# Patient Record
Sex: Male | Born: 1975 | Race: Black or African American | Marital: Single | Smoking: Current every day smoker
Health system: Southern US, Community
[De-identification: ages and names within clinical notes are randomized; demographics above are authoritative.]

## PROBLEM LIST (undated history)

## (undated) DIAGNOSIS — J45909 Unspecified asthma, uncomplicated: Secondary | ICD-10-CM

## (undated) DIAGNOSIS — T7840XA Allergy, unspecified, initial encounter: Secondary | ICD-10-CM

## (undated) HISTORY — DX: Allergy, unspecified, initial encounter: T78.40XA

## (undated) HISTORY — PX: TUMOR REMOVAL: SHX12

---

## 2003-10-19 ENCOUNTER — Ambulatory Visit (HOSPITAL_COMMUNITY): Admission: RE | Admit: 2003-10-19 | Discharge: 2003-10-19 | Payer: Self-pay | Admitting: Family Medicine

## 2004-11-30 ENCOUNTER — Emergency Department (HOSPITAL_COMMUNITY): Admission: EM | Admit: 2004-11-30 | Discharge: 2004-11-30 | Payer: Self-pay | Admitting: Family Medicine

## 2005-07-26 ENCOUNTER — Ambulatory Visit: Payer: Self-pay | Admitting: Family Medicine

## 2005-07-31 ENCOUNTER — Ambulatory Visit: Payer: Self-pay | Admitting: Family Medicine

## 2005-10-19 ENCOUNTER — Ambulatory Visit: Payer: Self-pay | Admitting: Family Medicine

## 2006-07-30 ENCOUNTER — Ambulatory Visit: Payer: Self-pay | Admitting: Family Medicine

## 2007-01-21 DIAGNOSIS — J309 Allergic rhinitis, unspecified: Secondary | ICD-10-CM | POA: Insufficient documentation

## 2007-01-21 DIAGNOSIS — J45909 Unspecified asthma, uncomplicated: Secondary | ICD-10-CM | POA: Insufficient documentation

## 2010-05-06 ENCOUNTER — Encounter: Payer: Self-pay | Admitting: Family Medicine

## 2010-08-17 ENCOUNTER — Ambulatory Visit: Payer: Self-pay | Admitting: Family Medicine

## 2010-09-28 ENCOUNTER — Ambulatory Visit (HOSPITAL_COMMUNITY)
Admission: RE | Admit: 2010-09-28 | Discharge: 2010-09-28 | Disposition: A | Discharge status: Home / Self Care | Payer: 59 | Source: Ambulatory Visit | Attending: Family Medicine | Admitting: Family Medicine

## 2010-09-28 ENCOUNTER — Other Ambulatory Visit: Payer: Self-pay | Admitting: Family Medicine

## 2010-09-28 DIAGNOSIS — R0789 Other chest pain: Secondary | ICD-10-CM

## 2010-09-28 DIAGNOSIS — R079 Chest pain, unspecified: Secondary | ICD-10-CM | POA: Insufficient documentation

## 2010-09-28 DIAGNOSIS — M25819 Other specified joint disorders, unspecified shoulder: Secondary | ICD-10-CM | POA: Insufficient documentation

## 2011-10-21 ENCOUNTER — Ambulatory Visit (INDEPENDENT_AMBULATORY_CARE_PROVIDER_SITE_OTHER): Payer: 59 | Admitting: Family Medicine

## 2011-10-21 VITALS — BP 140/100 | HR 78 | Resp 16 | Ht 75.0 in | Wt 246.0 lb

## 2011-10-21 DIAGNOSIS — J45909 Unspecified asthma, uncomplicated: Secondary | ICD-10-CM

## 2011-10-21 DIAGNOSIS — L259 Unspecified contact dermatitis, unspecified cause: Secondary | ICD-10-CM

## 2011-10-21 DIAGNOSIS — L309 Dermatitis, unspecified: Secondary | ICD-10-CM

## 2011-10-21 MED ORDER — MOMETASONE FURO-FORMOTEROL FUM 200-5 MCG/ACT IN AERO: 2.0000 | INHALATION_SPRAY | Freq: Two times a day (BID) | RESPIRATORY_TRACT | Status: DC

## 2011-10-21 MED ORDER — PREDNISONE 20 MG PO TABS: ORAL_TABLET | ORAL | Status: DC

## 2011-10-21 NOTE — Progress Notes (Signed)
@[email or web address] @   Patient ID: IZEN PETZ MRN: [id number], DOB: 06/12/1975, 36 y.o. Date of Encounter: 10/21/2011, 9:00 AM  Primary Physician: No primary provider on file.  Chief Complaint:  Chief Complaint  Patient presents with  . Eczema    behind right ear  . Cough    chest congestion x 2 weeks    HPI: 36 y.o. year old male presents with a 14 day history of nasal congestion, post nasal drip, sore throat, and cough. Mild sinus pressure. Afebrile. No chills. Nasal congestion thick and green/yellow. Cough is productive of green/yellow sputum and not associated with time of day. Ears feel full, leading to sensation of muffled hearing. Has tried OTC cold preps without success. No GI complaints. Appetite good  No sick contacts, recent antibiotics, or recent travels.   No leg trauma, sedentary periods, h/o cancer, or tobacco use.  No past medical history on file.   Home Meds: Prior to Admission medications   Medication Sig Start Date End Date Taking? Authorizing Provider  Mometasone Furo-Formoterol Fum 200-5 MCG/ACT AERO Inhale 2 puffs into the lungs 2 (two) times daily. 10/21/11   Elvina Sidle, MD  predniSONE (DELTASONE) 20 MG tablet 3-2-2-1-1-1 daily with food 10/21/11   Elvina Sidle, MD    Allergies: No Known Allergies  History   Social History  . Marital Status: Single    Spouse Name: N/A    Number of Children: N/A  . Years of Education: N/A   Occupational History  . Not on file.   Social History Main Topics  . Smoking status: Current Everyday Smoker -- 0.1 packs/day for 20 years    Types: Cigars  . Smokeless tobacco: Not on file  . Alcohol Use: Not on file  . Drug Use: Not on file  . Sexually Active: Not on file   Other Topics Concern  . Not on file   Social History Narrative  . No narrative on file     Review of Systems: Constitutional: negative for chills, fever, night sweats or weight changes Cardiovascular: negative for chest pain or  palpitations Respiratory: negative for hemoptysis, wheezing, or shortness of breath Abdominal: negative for abdominal pain, nausea, vomiting or diarrhea Dermatological: itchy facial rash Neurologic: negative for headache   Physical Exam: Blood pressure 140/100, pulse 78, resp. rate 16, height 6\' 3"  (1.905 m), weight 246 lb (111.585 kg), SpO2 96.00%., Body mass index is 30.75 kg/(m^2). General: Well developed, well nourished, in no acute distress. Head: Normocephalic, atraumatic, eyes without discharge, sclera non-icteric, nares are congested. Bilateral auditory canals clear, TM's are without perforation, pearly grey with reflective cone of light bilaterally. No sinus TTP. Oral cavity moist, dentition normal. Posterior pharynx with post nasal drip and mild erythema. No peritonsillar abscess or tonsillar exudate.  Eczematous rash right ear lobe Neck: Supple. No thyromegaly. Full ROM. No lymphadenopathy. Lungs: Coarse breath sounds bilaterally without wheezes, rales, or rhonchi. Breathing is unlabored.  Heart: RRR with S1 S2. No murmurs, rubs, or gallops appreciated. Msk:  Strength and tone normal for age. Extremities: No clubbing or cyanosis. No edema. Neuro: Alert and oriented X 3. Moves all extremities spontaneously. CNII-XII grossly in tact. Psych:  Responds to questions appropriately with a normal affect.   Labs:   ASSESSMENT AND PLAN:  36 y.o. year old male with bronchitis, asthmatic, and eczema - 1. Asthma  Mometasone Furo-Formoterol Fum 200-5 MCG/ACT AERO, predniSONE (DELTASONE) 20 MG tablet  2. Eczema  predniSONE (DELTASONE) 20 MG tablet   Urged to quit smoking  -  Tylenol/Motrin prn -Rest/fluids -RTC precautions -RTC 3-5 days if no improvement  Signed, Elvina Sidle, MD 10/21/2011 9:00 AM

## 2011-10-21 NOTE — Patient Instructions (Addendum)
Smoking Cessation This document explains the best ways for you to quit smoking and new treatments to help. It lists new medicines that can double or triple your chances of quitting and quitting for good. It also considers ways to avoid relapses and concerns you may have about quitting, including weight gain. NICOTINE: A POWERFUL ADDICTION If you have tried to quit smoking, you know how hard it can be. It is hard because nicotine is a very addictive drug. For some people, it can be as addictive as heroin or cocaine. Usually, people make 2 or 3 tries, or more, before finally being able to quit. Each time you try to quit, you can learn about what helps and what hurts. Quitting takes hard work and a lot of effort, but you can quit smoking. QUITTING SMOKING IS ONE OF THE MOST IMPORTANT THINGS YOU WILL EVER DO.  You will live longer, feel better, and live better.   The impact on your body of quitting smoking is felt almost immediately:   Within 20 minutes, blood pressure decreases. Pulse returns to its normal level.   After 8 hours, carbon monoxide levels in the blood return to normal. Oxygen level increases.   After 24 hours, chance of heart attack starts to decrease. Breath, hair, and body stop smelling like smoke.   After 48 hours, damaged nerve endings begin to recover. Sense of taste and smell improve.   After 72 hours, the body is virtually free of nicotine. Bronchial tubes relax and breathing becomes easier.   After 2 to 12 weeks, lungs can hold more air. Exercise becomes easier and circulation improves.   Quitting will reduce your risk of having a heart attack, stroke, cancer, or lung disease:   After 1 year, the risk of coronary heart disease is cut in half.   After 5 years, the risk of stroke falls to the same as a nonsmoker.   After 10 years, the risk of lung cancer is cut in half and the risk of other cancers decreases significantly.   After 15 years, the risk of coronary heart  disease drops, usually to the level of a nonsmoker.   If you are pregnant, quitting smoking will improve your chances of having a healthy baby.   The people you live with, especially your children, will be healthier.   You will have extra money to spend on things other than cigarettes.  FIVE KEYS TO QUITTING Studies have shown that these 5 steps will help you quit smoking and quit for good. You have the best chances of quitting if you use them together: 1. Get ready.  2. Get support and encouragement.  3. Learn new skills and behaviors.  4. Get medicine to reduce your nicotine addiction and use it correctly.  5. Be prepared for relapse or difficult situations. Be determined to continue trying to quit, even if you do not succeed at first.  1. GET READY  Set a quit date.   Change your environment.   Get rid of ALL cigarettes, ashtrays, matches, and lighters in your home, car, and place of work.   Do not let people smoke in your home.   Review your past attempts to quit. Think about what worked and what did not.   Once you quit, do not smoke. NOT EVEN A PUFF!  2. GET SUPPORT AND ENCOURAGEMENT Studies have shown that you have a better chance of being successful if you have help. You can get support in many ways.  Tell   your family, friends, and coworkers that you are going to quit and need their support. Ask them not to smoke around you.   Talk to your caregivers (doctor, dentist, nurse, pharmacist, psychologist, and/or smoking counselor).   Get individual, group, or telephone counseling and support. The more counseling you have, the better your chances are of quitting. Programs are available at local hospitals and health centers. Call your local health department for information about programs in your area.   Spiritual beliefs and practices may help some smokers quit.   Quit meters are small computer programs online or downloadable that keep track of quit statistics, such as amount  of "quit-time," cigarettes not smoked, and money saved.   Many smokers find one or more of the many self-help books available useful in helping them quit and stay off tobacco.  3. LEARN NEW SKILLS AND BEHAVIORS  Try to distract yourself from urges to smoke. Talk to someone, go for a walk, or occupy your time with a task.   When you first try to quit, change your routine. Take a different route to work. Drink tea instead of coffee. Eat breakfast in a different place.   Do something to reduce your stress. Take a hot bath, exercise, or read a book.   Plan something enjoyable to do every day. Reward yourself for not smoking.   Explore interactive web-based programs that specialize in helping you quit.  4. GET MEDICINE AND USE IT CORRECTLY Medicines can help you stop smoking and decrease the urge to smoke. Combining medicine with the above behavioral methods and support can quadruple your chances of successfully quitting smoking. The [place] Food and Drug Administration (FDA) has approved 7 medicines to help you quit smoking. These medicines fall into 3 categories.  Nicotine replacement therapy (delivers nicotine to your body without the negative effects and risks of smoking):   Nicotine gum: Available over-the-counter.   Nicotine lozenges: Available over-the-counter.   Nicotine inhaler: Available by prescription.   Nicotine nasal spray: Available by prescription.   Nicotine skin patches (transdermal): Available by prescription and over-the-counter.   Antidepressant medicine (helps people abstain from smoking, but how this works is unknown):   Bupropion sustained-release (SR) tablets: Available by prescription.   Nicotinic receptor partial agonist (simulates the effect of nicotine in your brain):   Varenicline tartrate tablets: Available by prescription.   Ask your caregiver for advice about which medicines to use and how to use them. Carefully read the information on the package.    Everyone who is trying to quit may benefit from using a medicine. If you are pregnant or trying to become pregnant, nursing an infant, you are under age 18, or you smoke fewer than 10 cigarettes per day, talk to your caregiver before taking any nicotine replacement medicines.   You should stop using a nicotine replacement product and call your caregiver if you experience nausea, dizziness, weakness, vomiting, fast or irregular heartbeat, mouth problems with the lozenge or gum, or redness or swelling of the skin around the patch that does not go away.   Do not use any other product containing nicotine while using a nicotine replacement product.   Talk to your caregiver before using these products if you have diabetes, heart disease, asthma, stomach ulcers, you had a recent heart attack, you have high blood pressure that is not controlled with medicine, a history of irregular heartbeat, or you have been prescribed medicine to help you quit smoking.  5. BE PREPARED FOR RELAPSE OR   DIFFICULT SITUATIONS  Most relapses occur within the first 3 months after quitting. Do not be discouraged if you start smoking again. Remember, most people try several times before they finally quit.   You may have symptoms of withdrawal because your body is used to nicotine. You may crave cigarettes, be irritable, feel very hungry, cough often, get headaches, or have difficulty concentrating.   The withdrawal symptoms are only temporary. They are strongest when you first quit, but they will go away within 10 to 14 days.  Here are some difficult situations to watch for:  Alcohol. Avoid drinking alcohol. Drinking lowers your chances of successfully quitting.   Caffeine. Try to reduce the amount of caffeine you consume. It also lowers your chances of successfully quitting.   Other smokers. Being around smoking can make you want to smoke. Avoid smokers.   Weight gain. Many smokers will gain weight when they quit, usually  less than 10 pounds. Eat a healthy diet and stay active. Do not let weight gain distract you from your main goal, quitting smoking. Some medicines that help you quit smoking may also help delay weight gain. You can always lose the weight gained after you quit.   Bad mood or depression. There are a lot of ways to improve your mood other than smoking.  If you are having problems with any of these situations, talk to your caregiver. SPECIAL SITUATIONS AND CONDITIONS Studies suggest that everyone can quit smoking. Your situation or condition can give you a special reason to quit.  Pregnant women/new mothers: By quitting, you protect your baby's health and your own.   Hospitalized patients: By quitting, you reduce health problems and help healing.   Heart attack patients: By quitting, you reduce your risk of a second heart attack.   Lung, head, and neck cancer patients: By quitting, you reduce your chance of a second cancer.   Parents of children and adolescents: By quitting, you protect your children from illnesses caused by secondhand smoke.  QUESTIONS TO THINK ABOUT Think about the following questions before you try to stop smoking. You may want to talk about your answers with your caregiver.  Why do you want to quit?   If you tried to quit in the past, what helped and what did not?   What will be the most difficult situations for you after you quit? How will you plan to handle them?   Who can help you through the tough times? Your family? Friends? Caregiver?   What pleasures do you get from smoking? What ways can you still get pleasure if you quit?  Here are some questions to ask your caregiver:  How can you help me to be successful at quitting?   What medicine do you think would be best for me and how should I take it?   What should I do if I need more help?   What is smoking withdrawal like? How can I get information on withdrawal?  Quitting takes hard work and a lot of effort,  but you can quit smoking. FOR MORE INFORMATION  Smokefree.gov (http://www.davis-sullivan.com/) provides free, accurate, evidence-based information and professional assistance to help support the immediate and long-term needs of people trying to quit smoking. Document Released: 03/27/2001 Document Revised: 03/22/2011 Document Reviewed: 01/17/2009 [facility] [place] Patient Information 2012 [place], [place]   .Asthma Attack Prevention HOW CAN ASTHMA BE PREVENTED? Currently, there is no way to prevent asthma from starting. However, you can take steps to control the disease and prevent  its symptoms after you have been diagnosed. Learn about your asthma and how to control it. Take an active role to control your asthma by working with your caregiver to create and follow an asthma action plan. An asthma action plan guides you in taking your medicines properly, avoiding factors that make your asthma worse, tracking your level of asthma control, responding to worsening asthma, and seeking emergency care when needed. To track your asthma, keep records of your symptoms, check your peak flow number using a peak flow meter (handheld device that shows how well air moves out of your lungs), and get regular asthma checkups.  Other ways to prevent asthma attacks include:  Use medicines as your caregiver directs.   Identify and avoid things that make your asthma worse (as much as you can).   Keep track of your asthma symptoms and level of control.   Get regular checkups for your asthma.   With your caregiver, write a detailed plan for taking medicines and managing an asthma attack. Then be sure to follow your action plan. Asthma is an ongoing condition that needs regular monitoring and treatment.   Identify and avoid asthma triggers. A number of outdoor allergens and irritants (pollen, mold, cold air, air pollution) can trigger asthma attacks. Find out what causes or makes your asthma worse, and take steps to avoid those  triggers (see below).   Monitor your breathing. Learn to recognize warning signs of an attack, such as slight coughing, wheezing or shortness of breath. However, your lung function may already decrease before you notice any signs or symptoms, so regularly measure and record your peak airflow with a home peak flow meter.   Identify and treat attacks early. If you act quickly, you're less likely to have a severe attack. You will also need less medicine to control your symptoms. When your peak flow measurements decrease and alert you to an upcoming attack, take your medicine as instructed, and immediately stop any activity that may have triggered the attack. If your symptoms do not improve, get medical help.   Pay attention to increasing quick-relief inhaler use. If you find yourself relying on your quick-relief inhaler (such as albuterol), your asthma is not under control. See your caregiver about adjusting your treatment.  IDENTIFY AND CONTROL FACTORS THAT MAKE YOUR ASTHMA WORSE A number of common things can set off or make your asthma symptoms worse (asthma triggers). Keep track of your asthma symptoms for several weeks, detailing all the environmental and emotional factors that are linked with your asthma. When you have an asthma attack, go back to your asthma diary to see which factor, or combination of factors, might have contributed to it. Once you know what these factors are, you can take steps to control many of them.  Allergies: If you have allergies and asthma, it is important to take asthma prevention steps at home. Asthma attacks (worsening of asthma symptoms) can be triggered by allergies, which can cause temporary increased inflammation of your airways. Minimizing contact with the substance to which you are allergic will help prevent an asthma attack. Animal Dander:   Some people are allergic to the flakes of skin or dried saliva from animals with fur or feathers. Keep these pets out of your  home.   If you can't keep a pet outdoors, keep the pet out of your bedroom and other sleeping areas at all times, and keep the door closed.   Remove carpets and furniture covered with cloth from your  home. If that is not possible, keep the pet away from fabric-covered furniture and carpets.  Dust Mites:  Many people with asthma are allergic to dust mites. Dust mites are tiny bugs that are found in every home, in mattresses, pillows, carpets, fabric-covered furniture, bedcovers, clothes, stuffed toys, fabric, and other fabric-covered items.   Cover your mattress in a special dust-proof cover.   Cover your pillow in a special dust-proof cover, or wash the pillow each week in hot water. Water must be hotter than 130 F to kill dust mites. Cold or warm water used with detergent and bleach can also be effective.   Wash the sheets and blankets on your bed each week in hot water.   Try not to sleep or lie on cloth-covered cushions.   Call ahead when traveling and ask for a smoke-free hotel room. Bring your own bedding and pillows, in case the hotel only supplies feather pillows and down comforters, which may contain dust mites and cause asthma symptoms.   Remove carpets from your bedroom and those laid on concrete, if you can.   Keep stuffed toys out of the bed, or wash the toys weekly in hot water or cooler water with detergent and bleach.  Cockroaches:  Many people with asthma are allergic to the droppings and remains of cockroaches.   Keep food and garbage in closed containers. Never leave food out.   Use poison baits, traps, powders, gels, or paste (for example, boric acid).   If a spray is used to kill cockroaches, stay out of the room until the odor goes away.  Indoor Mold:  Fix leaky faucets, pipes, or other sources of water that have mold around them.   Clean moldy surfaces with a cleaner that has bleach in it.  Pollen and Outdoor Mold:  When pollen or mold spore counts are  high, try to keep your windows closed.   Stay indoors with windows closed from late morning to afternoon, if you can. Pollen and some mold spore counts are highest at that time.   Ask your caregiver whether you need to take or increase anti-inflammatory medicine before your allergy season starts.  Irritants:   Tobacco smoke is an irritant. If you smoke, ask your caregiver how you can quit. Ask family members to quit smoking, too. Do not allow smoking in your home or car.   If possible, do not use a wood-burning stove, kerosene heater, or fireplace. Minimize exposure to all sources of smoke, including incense, candles, fires, and fireworks.   Try to stay away from strong odors and sprays, such as perfume, talcum powder, hair spray, and paints.   Decrease humidity in your home and use an indoor air cleaning device. Reduce indoor humidity to below 60 percent. Dehumidifiers or central air conditioners can do this.   Try to have someone else vacuum for you once or twice a week, if you can. Stay out of rooms while they are being vacuumed and for a short while afterward.   If you vacuum, use a dust mask from a hardware store, a double-layered or microfilter vacuum cleaner bag, or a vacuum cleaner with a HEPA filter.   Sulfites in foods and beverages can be irritants. Do not drink beer or wine, or eat dried fruit, processed potatoes, or shrimp if they cause asthma symptoms.   Cold air can trigger an asthma attack. Cover your nose and mouth with a scarf on cold or windy days.   Several health conditions can  make asthma more difficult to manage, including runny nose, sinus infections, reflux disease, psychological stress, and sleep apnea. Your caregiver will treat these conditions, as well.   Avoid close contact with people who have a cold or the flu, since your asthma symptoms may get worse if you catch the infection from them. Wash your hands thoroughly after touching items that may have been  handled by people with a respiratory infection.   Get a flu shot every year to protect against the flu virus, which often makes asthma worse for days or weeks. Also get a pneumonia shot once every five to 10 years.  Drugs:  Aspirin and other painkillers can cause asthma attacks. 10% to 20% of people with asthma have sensitivity to aspirin or a group of painkillers called non-steroidal anti-inflammatory drugs (NSAIDS), such as ibuprofen and naproxen. These drugs are used to treat pain and reduce fevers. Asthma attacks caused by any of these medicines can be severe and even fatal. These drugs must be avoided in people who have known aspirin sensitive asthma. Products with acetaminophen are considered safe for people who have asthma. It is important that people with aspirin sensitivity read labels of all over-the-counter drugs used to treat pain, colds, coughs, and fever.   Beta blockers and ACE inhibitors are other drugs which you should discuss with your caregiver, in relation to your asthma.  ALLERGY SKIN TESTING  Ask your asthma caregiver about allergy skin testing or blood testing (RAST test) to identify the allergens to which you are sensitive. If you are found to have allergies, allergy shots (immunotherapy) for asthma may help prevent future allergies and asthma. With allergy shots, small doses of allergens (substances to which you are allergic) are injected under your skin on a regular schedule. Over a period of time, your body may become used to the allergen and less responsive with asthma symptoms. You can also take measures to minimize your exposure to those allergens. EXERCISE  If you have exercise-induced asthma, or are planning vigorous exercise, or exercise in cold, humid, or dry environments, prevent exercise-induced asthma by following your caregiver's advice regarding asthma treatment before exercising. Document Released: 03/21/2009 Document Revised: 03/22/2011 Document Reviewed:  03/21/2009 [facility] Patient Information 2012 [place], [place].

## 2011-10-31 ENCOUNTER — Telehealth: Payer: Self-pay

## 2011-10-31 NOTE — Telephone Encounter (Signed)
PT STATES THAT HE HAS BEEN TAKING THE PREDNISONE PRESCRIBED TO HIM BUT HE IS STILL HAVING CONGESTION IN HIS CHEST. PLEASE ADVISE. [phone] PHARMACY:[facility] ON [address]

## 2011-10-31 NOTE — Telephone Encounter (Signed)
Per Dr. Loma Boston note, he was to RTC in 3-5 days if no improvement. He should be using inhaler as well.

## 2011-10-31 NOTE — Telephone Encounter (Signed)
Lm for call back.

## 2011-11-04 NOTE — Telephone Encounter (Signed)
[facility] notifying patient info below, and to RTC if not better.

## 2012-01-09 ENCOUNTER — Emergency Department (HOSPITAL_COMMUNITY)
Admission: EM | Admit: 2012-01-09 | Discharge: 2012-01-10 | Disposition: A | Discharge status: Home / Self Care | Payer: 59 | Attending: Emergency Medicine | Admitting: Emergency Medicine

## 2012-01-09 ENCOUNTER — Encounter (HOSPITAL_COMMUNITY): Payer: Self-pay | Admitting: *Deleted

## 2012-01-09 DIAGNOSIS — T2200XA Burn of unspecified degree of shoulder and upper limb, except wrist and hand, unspecified site, initial encounter: Secondary | ICD-10-CM

## 2012-01-09 DIAGNOSIS — X12XXXA Contact with other hot fluids, initial encounter: Secondary | ICD-10-CM | POA: Insufficient documentation

## 2012-01-09 DIAGNOSIS — T310 Burns involving less than 10% of body surface: Secondary | ICD-10-CM | POA: Insufficient documentation

## 2012-01-09 DIAGNOSIS — T23239A Burn of second degree of unspecified multiple fingers (nail), not including thumb, initial encounter: Secondary | ICD-10-CM | POA: Insufficient documentation

## 2012-01-09 DIAGNOSIS — T2016XA Burn of first degree of forehead and cheek, initial encounter: Secondary | ICD-10-CM | POA: Insufficient documentation

## 2012-01-09 DIAGNOSIS — T22219A Burn of second degree of unspecified forearm, initial encounter: Secondary | ICD-10-CM | POA: Insufficient documentation

## 2012-01-09 HISTORY — DX: Unspecified asthma, uncomplicated: J45.909

## 2012-01-09 MED ORDER — HYDROMORPHONE HCL PF 2 MG/ML IJ SOLN
2.0000 mg | Freq: Once | INTRAMUSCULAR | Status: AC
  Administered 2012-01-09: 2 mg via INTRAVENOUS
  Filled 2012-01-09: qty 1

## 2012-01-09 MED ORDER — SILVER SULFADIAZINE 1 % EX CREA
TOPICAL_CREAM | Freq: Once | CUTANEOUS | Status: AC
  Administered 2012-01-09: via TOPICAL
  Filled 2012-01-09: qty 50

## 2012-01-09 MED ORDER — SODIUM CHLORIDE 0.9 % IV BOLUS (SEPSIS)
1000.0000 mL | Freq: Once | INTRAVENOUS | Status: AC
  Administered 2012-01-09: 1000 mL via INTRAVENOUS

## 2012-01-09 MED ORDER — TETANUS-DIPHTH-ACELL PERTUSSIS 5-2.5-18.5 LF-MCG/0.5 IM SUSP
0.5000 mL | Freq: Once | INTRAMUSCULAR | Status: AC
  Administered 2012-01-09: 0.5 mL via INTRAMUSCULAR
  Filled 2012-01-09: qty 0.5

## 2012-01-09 MED ORDER — KETOROLAC TROMETHAMINE 30 MG/ML IJ SOLN
30.0000 mg | Freq: Once | INTRAMUSCULAR | Status: AC
  Administered 2012-01-10: 30 mg via INTRAVENOUS
  Filled 2012-01-09: qty 1

## 2012-01-09 MED ORDER — ONDANSETRON HCL 4 MG PO TABS: 4.0000 mg | ORAL_TABLET | Freq: Four times a day (QID) | ORAL | Status: DC

## 2012-01-09 MED ORDER — OXYCODONE-ACETAMINOPHEN 5-325 MG PO TABS: 1.0000 | ORAL_TABLET | Freq: Four times a day (QID) | ORAL | Status: DC | PRN

## 2012-01-09 MED ORDER — ONDANSETRON HCL 4 MG/2ML IJ SOLN
4.0000 mg | Freq: Once | INTRAMUSCULAR | Status: AC
  Administered 2012-01-09: 4 mg via INTRAVENOUS
  Filled 2012-01-09: qty 2
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
## 2012-01-09 NOTE — ED Notes (Signed)
Cook, MD at bedside.  

## 2012-01-12 ENCOUNTER — Encounter (HOSPITAL_COMMUNITY): Payer: Self-pay | Admitting: Emergency Medicine

## 2012-01-12 ENCOUNTER — Emergency Department (HOSPITAL_COMMUNITY)
Admission: EM | Admit: 2012-01-12 | Discharge: 2012-01-12 | Disposition: A | Discharge status: Home / Self Care | Payer: 59 | Attending: Emergency Medicine | Admitting: Emergency Medicine

## 2012-01-12 DIAGNOSIS — F17200 Nicotine dependence, unspecified, uncomplicated: Secondary | ICD-10-CM | POA: Insufficient documentation

## 2012-01-12 DIAGNOSIS — T3110 Burns involving 10-19% of body surface with 0% to 9% third degree burns: Secondary | ICD-10-CM | POA: Insufficient documentation

## 2012-01-12 DIAGNOSIS — J45909 Unspecified asthma, uncomplicated: Secondary | ICD-10-CM | POA: Insufficient documentation

## 2012-01-12 DIAGNOSIS — Z09 Encounter for follow-up examination after completed treatment for conditions other than malignant neoplasm: Secondary | ICD-10-CM | POA: Insufficient documentation

## 2012-01-12 DIAGNOSIS — T3111 Burns involving 10-19% of body surface with 10-19% third degree burns: Secondary | ICD-10-CM | POA: Insufficient documentation

## 2012-01-12 DIAGNOSIS — X010XXA Exposure to flames in uncontrolled fire, not in building or structure, initial encounter: Secondary | ICD-10-CM | POA: Insufficient documentation

## 2012-01-12 DIAGNOSIS — T2030XA Burn of third degree of head, face, and neck, unspecified site, initial encounter: Secondary | ICD-10-CM | POA: Insufficient documentation

## 2012-01-12 DIAGNOSIS — T22219A Burn of second degree of unspecified forearm, initial encounter: Secondary | ICD-10-CM | POA: Insufficient documentation

## 2012-01-12 MED ORDER — SILVER SULFADIAZINE 1 % EX CREA: TOPICAL_CREAM | Freq: Every day | CUTANEOUS | Status: DC

## 2012-01-12 MED ORDER — CEPHALEXIN 500 MG PO CAPS: 500.0000 mg | ORAL_CAPSULE | Freq: Four times a day (QID) | ORAL | Status: DC

## 2012-01-12 MED ORDER — BACITRACIN 500 UNIT/GM EX OINT
1.0000 "application " | TOPICAL_OINTMENT | Freq: Two times a day (BID) | CUTANEOUS | Status: DC
  Administered 2012-01-12: 1 via TOPICAL
  Filled 2012-01-12 (×2): qty 0.9
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
## 2012-01-16 NOTE — ED Provider Notes (Signed)
Medical screening examination/treatment/procedure(s) were performed by non-physician practitioner and as supervising physician I was immediately available for consultation/collaboration.  Hurman Horn, MD 01/16/12 [phone]

## 2012-02-25 ENCOUNTER — Ambulatory Visit (INDEPENDENT_AMBULATORY_CARE_PROVIDER_SITE_OTHER): Payer: 59 | Admitting: Family Medicine

## 2012-02-25 VITALS — BP 122/76 | HR 90 | Temp 98.0°F | Resp 18 | Ht 74.0 in | Wt 258.4 lb

## 2012-02-25 DIAGNOSIS — R21 Rash and other nonspecific skin eruption: Secondary | ICD-10-CM

## 2012-02-25 DIAGNOSIS — N489 Disorder of penis, unspecified: Secondary | ICD-10-CM

## 2012-02-25 MED ORDER — KETOCONAZOLE 200 MG PO TABS: 200.0000 mg | ORAL_TABLET | Freq: Every day | ORAL | Status: DC

## 2012-02-25 MED ORDER — TRIAMCINOLONE ACETONIDE 0.1 % EX CREA: TOPICAL_CREAM | Freq: Two times a day (BID) | CUTANEOUS | Status: DC

## 2012-02-25 NOTE — Progress Notes (Signed)
This is a 36 year old gentleman works for [employer] who comes in with a half centimeter annular rash on the glans penis(5 days) and a 4 mm area of fissured skin (chronic) on the right earlobe. He's found that prednisones clears up the rash on the right earlobe in the past. He has a history of eczema and associated asthma but the asthma has been well controlled.   Objective: 4 mm eczematous patch on the right earlobe with central fissure. Glans penis shows a half centimeter annular hyperpigmented plaque.  Assessment: Tinea crura S.  Plan: 1. Rash of penis  ketoconazole (NIZORAL) 200 MG tablet, triamcinolone cream (KENALOG) 0.1 %, RPR  2. Rash of face     Check RPR.  Return if rash persists

## 2012-02-26 LAB — SYPHILIS: RPR W/REFLEX TO RPR TITER AND TREPONEMAL ANTIBODIES, TRADITIONAL SCREENING AND DIAGNOSIS ALGORITHM

## 2012-04-27 ENCOUNTER — Ambulatory Visit: Payer: 59 | Admitting: Family Medicine

## 2012-04-27 VITALS — BP 132/84 | HR 86 | Temp 98.8°F | Resp 17 | Ht 74.5 in | Wt 258.0 lb

## 2012-04-27 DIAGNOSIS — M6740 Ganglion, unspecified site: Secondary | ICD-10-CM

## 2012-04-27 DIAGNOSIS — R21 Rash and other nonspecific skin eruption: Secondary | ICD-10-CM

## 2012-04-27 DIAGNOSIS — B356 Tinea cruris: Secondary | ICD-10-CM

## 2012-04-27 MED ORDER — CLOTRIMAZOLE-BETAMETHASONE 1-0.05 % EX CREA: TOPICAL_CREAM | Freq: Two times a day (BID) | CUTANEOUS | Status: DC

## 2012-04-27 NOTE — Patient Instructions (Addendum)
Ganglion  A ganglion is a swelling under the skin that is filled with a thick, jelly-like substance. It is a synovial cyst. This is caused by a break (rupture) of the joint lining from the joint space. A ganglion often occurs near an area of repeated minor trauma (damage caused by an accident). Trauma may also be a repetitive movement at work or in a sport.  TREATMENT   It often goes away without treatment. It may reappear later. Sometimes a ganglion may need to be surgically removed. Often they are drained and injected with a steroid. Sometimes they respond to:   Rest.   Splinting.  HOME CARE INSTRUCTIONS    Your caregiver will decide the best way of treating your ganglion. Do not try to break the ganglion yourself by pressing on it, poking it with a needle, or hitting it with a heavy object.   Use medications as directed.  SEEK MEDICAL CARE IF:    The ganglion becomes larger or more painful.   You have increased redness or swelling.   You have weakness or numbness in your hand or wrist.  MAKE SURE YOU:    Understand these instructions.   Will watch your condition.   Will get help right away if you are not doing well or get worse.  Document Released: 03/30/2000 Document Revised: 06/25/2011 Document Reviewed: 05/27/2007  [employer] Patient Information 2013 [employer].

## 2012-04-27 NOTE — Progress Notes (Signed)
37 year old man works for [employer] comes in with persistent rash on the head of his penis which is surfaced by thickening as well tablets back in November but that hasn't helped. He also tries in the triamcinolone cream but that didn't help either. The rash is been there for 2 and half months now. He tested negative for STDs.  Patient also has a left thumb swelling at the MCP joint dorsally. It's not really causing him pain but is steadily increased in size. Has not affected range of motion either. He has no history of trauma to the thumb.  Objective: No acute distress, very friendly and cooperative Examination of the penis reveals well demarcated rash consistent with a tinea infection or lichen planus.  Examination of the left palm reveals full range of motion, no tenderness but a 1/2 cm fluctuant dorsal and CP joint swelling.  Assessment: Tinea versus versus lichen planus, ganglion cyst left thumb  Plan: 1. Tinea cruris  clotrimazole-betamethasone (LOTRISONE) cream  2. Ganglion cyst  Ambulatory referral to Orthopedic Surgery   If the rash persists more than 5 more days, will refer.

## 2012-09-06 IMAGING — CT CT CHEST W/O CM
2 of 5 series · 14 of 36 positions shown, 17 images · non-contrast
Comparison: None

CLINICAL DATA: Evaluate sternum

CT CHEST WITHOUT CONTRAST
TECHNIQUE: Multidetector CT imaging of the chest was performed
following the standard protocol without IV contrast.

[cor · coronal · 0.82mm/px · 3 of 95 slices shown]
[im 19/95  lung]
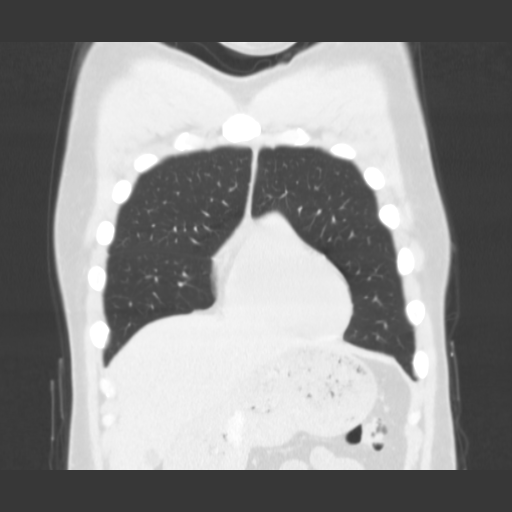
[im 38/95  lung]
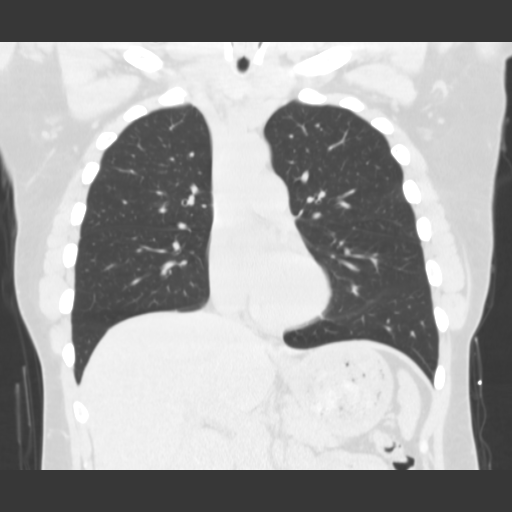
[im 57/95  lung]
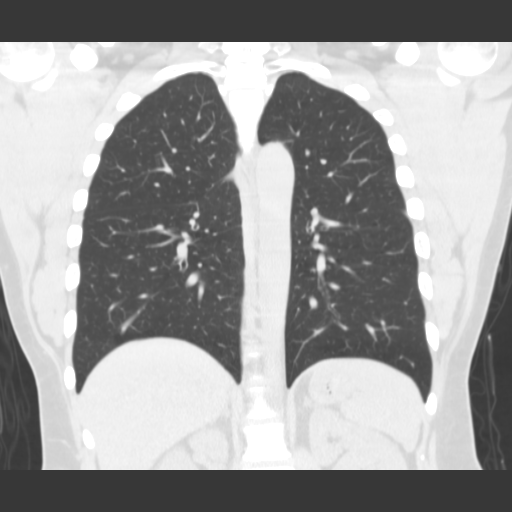

[bone · axial · 0.82mm/px · z∈[-245,+46]mm · 11 of 117 slices shown, 14 images]
[im 10/117  mediastinal]
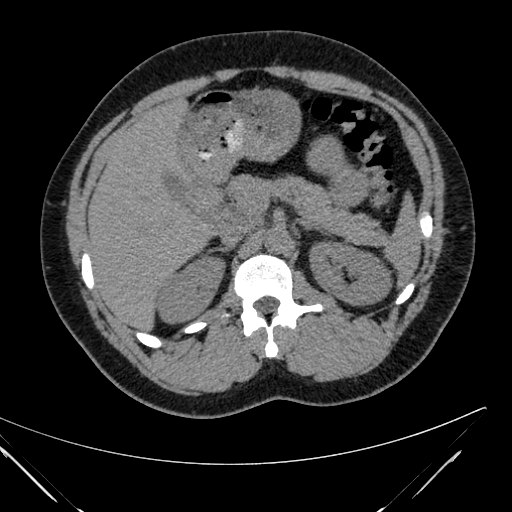
[im 10/117  lung]
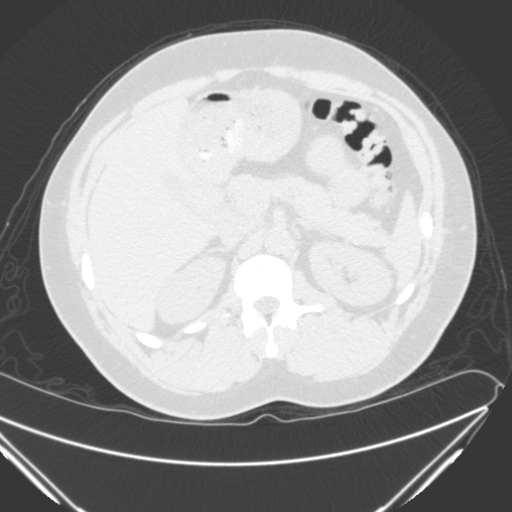
[im 20/117  lung]
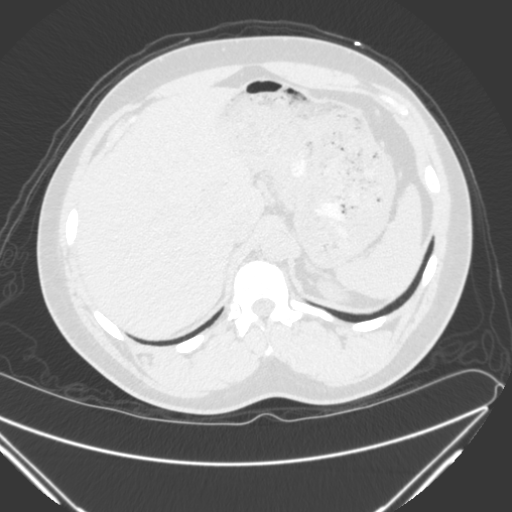
[im 30/117  lung]
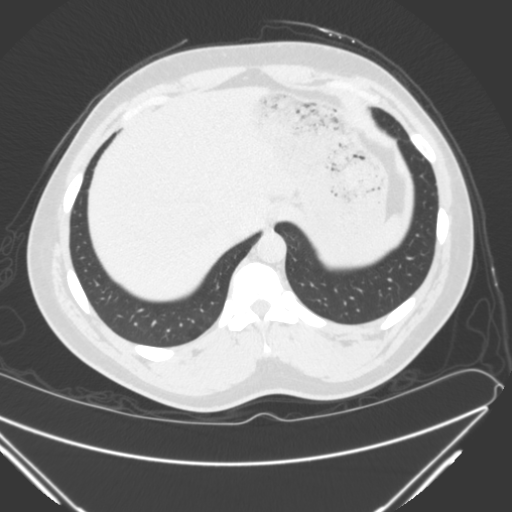
[im 39/117  lung]
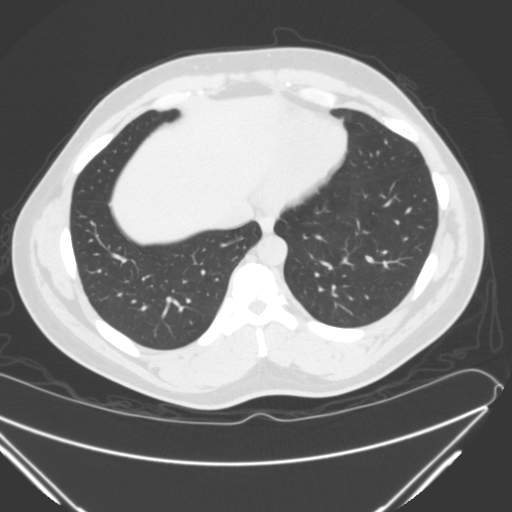
[im 49/117  mediastinal]
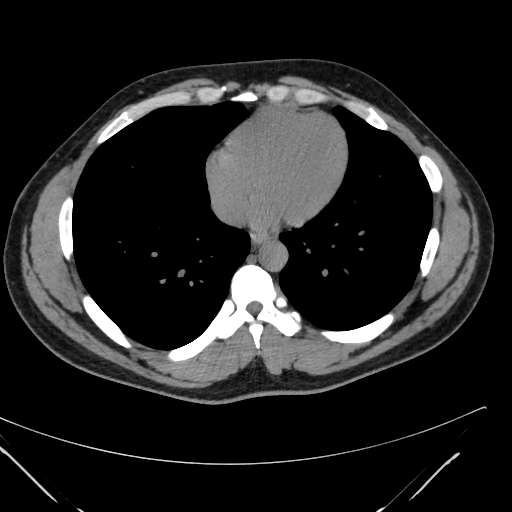
[im 49/117  lung]
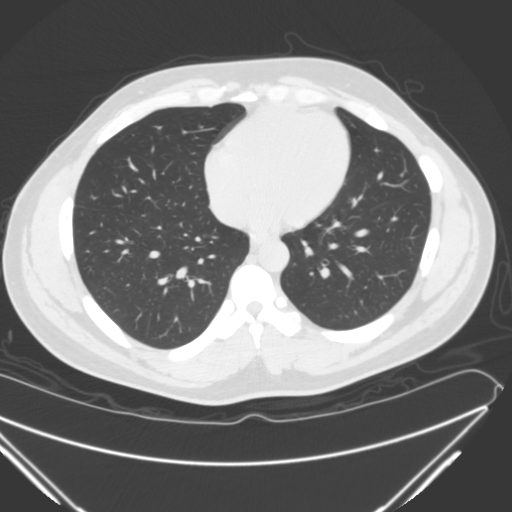
[im 59/117  lung]
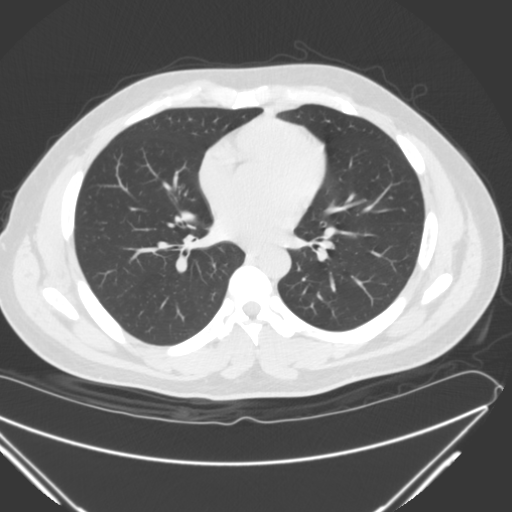
[im 68/117  lung]
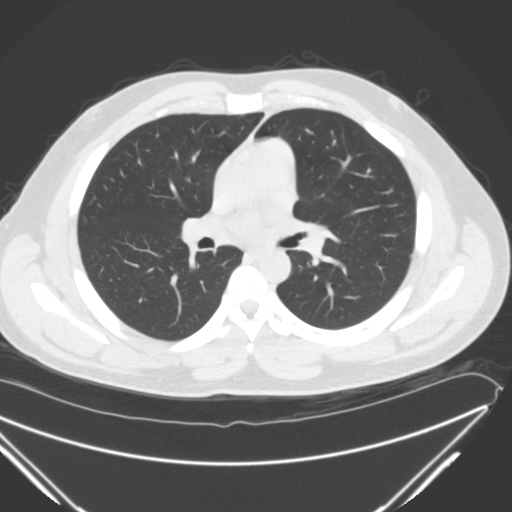
[im 78/117  lung]
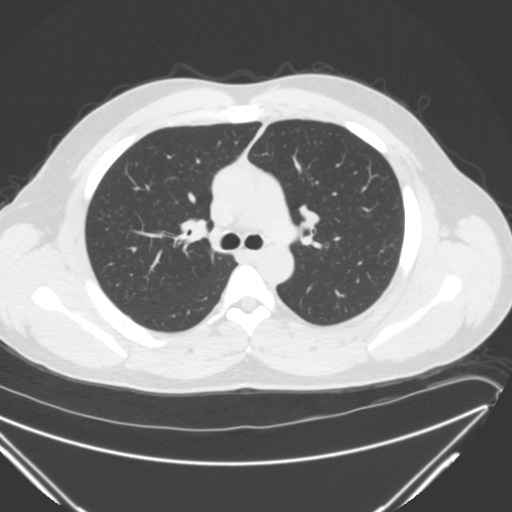
[im 88/117  mediastinal]
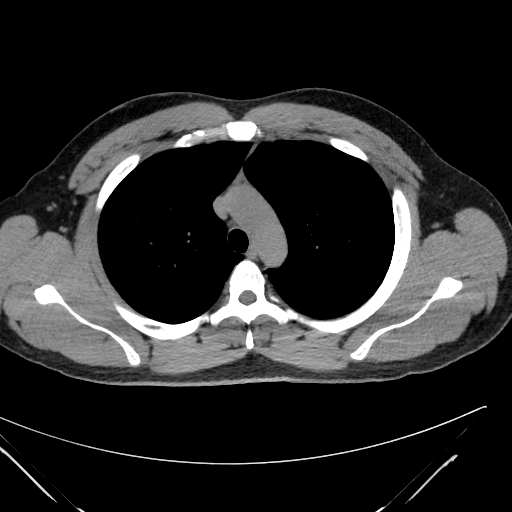
[im 88/117  lung]
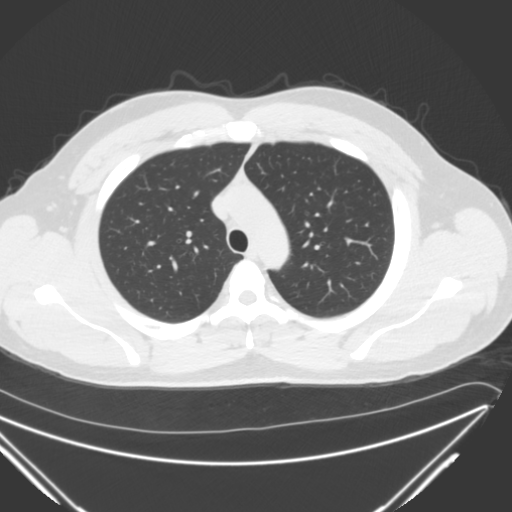
[im 97/117  lung]
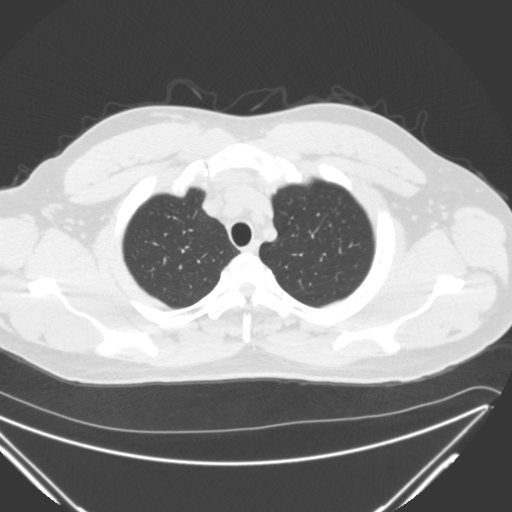
[im 107/117  lung]
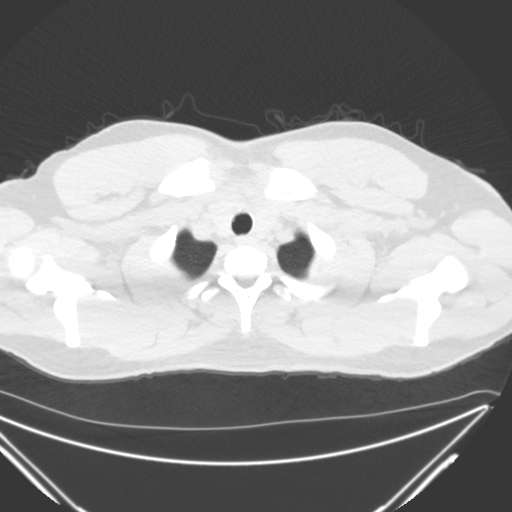

[14 of 36 positions shown; findings below may reference images not displayed]

FINDINGS: No enlarged axillary or supraclavicular lymph nodes.  Negative for
mediastinal or hilar adenopathy.  Bilateral gynecomastia.

No pericardial or pleural effusion identified.

No airspace consolidation or atelectasis.

No pulmonary nodule or mass identified.

There is slight asymmetry of the sternoclavicular joints with the
right sternoclavicular joint slightly more ventral and the left.
No dislocation or fracture noted.  The sternum is intact.
IMPRESSION: 1.  There is mild asymmetry of the sternoclavicular joints with the
right SC joint slightly more ventral than the left.

## 2013-09-09 ENCOUNTER — Ambulatory Visit: Payer: 59 | Admitting: Family Medicine

## 2013-09-09 VITALS — BP 120/72 | HR 98 | Temp 98.3°F | Resp 18 | Ht 74.5 in | Wt 262.0 lb

## 2013-09-09 DIAGNOSIS — B356 Tinea cruris: Secondary | ICD-10-CM

## 2013-09-09 DIAGNOSIS — B350 Tinea barbae and tinea capitis: Secondary | ICD-10-CM

## 2013-09-09 DIAGNOSIS — Z79899 Other long term (current) drug therapy: Secondary | ICD-10-CM

## 2013-09-09 DIAGNOSIS — N489 Disorder of penis, unspecified: Secondary | ICD-10-CM

## 2013-09-09 DIAGNOSIS — R21 Rash and other nonspecific skin eruption: Secondary | ICD-10-CM

## 2013-09-09 DIAGNOSIS — B351 Tinea unguium: Secondary | ICD-10-CM

## 2013-09-09 LAB — POCT SKIN KOH: SKIN KOH, POC: NEGATIVE

## 2013-09-09 MED ORDER — CLOTRIMAZOLE-BETAMETHASONE 1-0.05 % EX CREA: TOPICAL_CREAM | Freq: Two times a day (BID) | CUTANEOUS | Status: DC

## 2013-09-09 MED ORDER — TERBINAFINE HCL 250 MG PO TABS: 250.0000 mg | ORAL_TABLET | Freq: Every day | ORAL | Status: DC

## 2013-09-09 MED ORDER — TRIAMCINOLONE ACETONIDE 0.1 % EX CREA: TOPICAL_CREAM | Freq: Two times a day (BID) | CUTANEOUS | Status: DC

## 2013-09-09 NOTE — Progress Notes (Addendum)
Subjective:    Patient ID: Dakota Johnston, male    DOB: [birth year], 38 y.o.   MRN: [id number]   Chief Complaint  Patient presents with  . Rash    back, legs, genital area x2 mths    HPI  For the past 2-3 months has had a worsening rash on his right lower lateral leg - above ankle and also in his upper gluteal crease. Is bumpy and itchy.   Has had eczema behind his ear in the past - resolved w/ TAC cream.  Has a rash on the head of his penis for a long time. Was given first ketoconazole cream then lotrisone cream which both treats it but then will come back as soon as he stops using it.  Past Medical History  Diagnosis Date  . Asthma   . Allergy    No current outpatient prescriptions on file prior to visit.   No current facility-administered medications on file prior to visit.   No Known Allergies  Review of Systems  Constitutional: Negative for fever, chills, activity change and appetite change.  Cardiovascular: Negative for leg swelling.  Gastrointestinal: Negative for nausea, vomiting, abdominal pain, diarrhea and constipation.  Musculoskeletal: Negative for gait problem and joint swelling.  Skin: Positive for rash. Negative for wound.  Neurological: Negative for weakness and numbness.  Hematological: Negative for adenopathy. Does not bruise/bleed easily.      BP 120/72  Pulse 98  Temp(Src) 98.3 F (36.8 C) (Oral)  Resp 18  Ht 6' 2.5" (1.892 m)  Wt 262 lb (118.842 kg)  BMI 33.20 kg/m2  SpO2 100% Objective:   Physical Exam  Constitutional: He is oriented to person, place, and time. He appears well-developed and well-nourished. No distress.  HENT:  Head: Normocephalic and atraumatic.  Eyes: No scleral icterus.  Pulmonary/Chest: Effort normal.  Genitourinary: Circumcised. No penile erythema or penile tenderness. No discharge found.  Erythematous serpiginous plaques on glans of penis  Lymphadenopathy:       Right: No inguinal adenopathy present.       Left:  No inguinal adenopathy present.  Neurological: He is alert and oriented to person, place, and time.  Skin: Skin is warm and dry. Rash noted. He is not diaphoretic.  Toenails thickened, brown yellow, cracking bilaterally White scaling flaking skin between toes Well defined scaling plaques on right lateral lower legs hyperpigmented papules along upper gluteal crease  Psychiatric: He has a normal mood and affect. His behavior is normal.      Results for orders placed in visit on 09/09/13  COMPREHENSIVE METABOLIC PANEL      Result Value Ref Range   Sodium 140  135 - 145 mEq/L   Potassium 4.3  3.5 - 5.3 mEq/L   Chloride 103  96 - 112 mEq/L   CO2 26  19 - 32 mEq/L   Glucose, Bld 102 (*) 70 - 99 mg/dL   BUN 15  6 - 23 mg/dL   Creat 1.05  0.50 - 1.35 mg/dL   Total Bilirubin 0.6  0.2 - 1.2 mg/dL   Alkaline Phosphatase 54  39 - 117 U/L   AST 40 (*) 0 - 37 U/L   ALT 23  0 - 53 U/L   Total Protein 7.5  6.0 - 8.3 g/dL   Albumin 4.2  3.5 - 5.2 g/dL   Calcium 9.8  8.4 - 10.5 mg/dL  POCT SKIN KOH      Result Value Ref Range   Skin KOH, POC  Negative     Assessment & Plan:   Encounter for long-term (current) use of other medications - Plan: Comprehensive metabolic panel, POCT Skin KOH  Onychomycosis of toenail  Tinea cruris - Plan: clotrimazole-betamethasone (LOTRISONE) cream  Rash of penis - Plan:- suspect pt has not been able to rid genital fungal infection due to co-existing fungal infection in feet and toenails that has been untreated - start po lamisil (so check lfts today) and RTC in 1 mo after starting lamisil to recheck lfts and ensure infection is clearing.,  Leg rash - suspect eczema - start  triamcinolone cream (KENALOG) 0.1 % and transition to moisturizer when gone - see pt instructions Meds ordered this encounter  Medications  . terbinafine (LAMISIL) 250 MG tablet    Sig: Take 1 tablet (250 mg total) by mouth daily. NEEDS OV FOR FURTHER REFILLS    Dispense:  90 tablet     Refill:  1  . triamcinolone cream (KENALOG) 0.1 %    Sig: Apply topically 2 (two) times daily.    Dispense:  45 g    Refill:  6  . clotrimazole-betamethasone (LOTRISONE) cream    Sig: Apply topically 2 (two) times daily.    Dispense:  30 g    Refill:  1    Delman Cheadle, MD MPH

## 2013-09-09 NOTE — Patient Instructions (Signed)
Use the lotrisone only as needed on the head of your penis.  This should be treated by 10d of the oral medication lamisil but will hopefully stay away if we treat your toenails this time.  Come back in 2 months so we can ensure you are not having any liver irritation from the medication.   Apply the triamcinolone cream to your leg and gluteal crease until those rashes are completely gone and then switch over to good hypoallergenic thick moisturizer cream such as Eucerin, Cedaphil, or Aquaphor and apply this continually twice a day - especially immediately after showering to prevent it from coming back. General measures for eczema - In clinical experience, avoidance of irritants or exacerbating factors is beneficial for most patients with dyshidrotic eczema. General skin care measures aimed at reducing skin irritation and restoring the skin barrier include:  Using lukewarm water and soap-free cleansers to wash hands  Drying hands thoroughly after washing  Applying emollients (eg, petroleum jelly) immediately after hand drying and as often as possible  Wearing cotton gloves under vinyl or other nonlatex gloves when performing wet work  Removing rings and watches and bracelets before wet work  Wearing protective gloves in cold weather  Wearing task-specific gloves for frictional exposures (eg, gardening, carpentry)  Avoiding exposure to irritants (eg, detergents, solvents, hair lotions or dyes, acidic foods [eg, citrus fruit])  In clinical practice, astringent solutions such as aluminum subacetate (Burow's solution) or witch hazel are used for wet, weeping skin. Hands or feet are soaked in the solution for 15 minutes two to four times per day.  Eczema Eczema, also called atopic dermatitis, is a skin disorder that causes inflammation of the skin. It causes a red rash and dry, scaly skin. The skin becomes very itchy. Eczema is generally worse during the cooler winter months and often improves with the  warmth of summer. Eczema usually starts showing signs in infancy. Some children outgrow eczema, but it may last through adulthood.  CAUSES  The exact cause of eczema is not known, but it appears to run in families. People with eczema often have a family history of eczema, allergies, asthma, or hay fever. Eczema is not contagious. Flare-ups of the condition may be caused by:   Contact with something you are sensitive or allergic to.   Stress. SIGNS AND SYMPTOMS  Dry, scaly skin.   Red, itchy rash.   Itchiness. This may occur before the skin rash and may be very intense.  DIAGNOSIS  The diagnosis of eczema is usually made based on symptoms and medical history. TREATMENT  Eczema cannot be cured, but symptoms usually can be controlled with treatment and other strategies. A treatment plan might include:  Controlling the itching and scratching.   Use over-the-counter antihistamines as directed for itching. This is especially useful at night when the itching tends to be worse.   Use over-the-counter steroid creams as directed for itching.   Avoid scratching. Scratching makes the rash and itching worse. It may also result in a skin infection (impetigo) due to a break in the skin caused by scratching.   Keeping the skin well moisturized with creams every day. This will seal in moisture and help prevent dryness. Lotions that contain alcohol and water should be avoided because they can dry the skin.   Limiting exposure to things that you are sensitive or allergic to (allergens).   Recognizing situations that cause stress.   Developing a plan to manage stress.  HOME CARE INSTRUCTIONS  Only take over-the-counter or prescription medicines as directed by your health care provider.   Do not use anything on the skin without checking with your health care provider.   Keep baths or showers short (5 minutes) in warm (not hot) water. Use mild cleansers for bathing. These should be  unscented. You may add nonperfumed bath oil to the bath water. It is best to avoid soap and bubble bath.   Immediately after a bath or shower, when the skin is still damp, apply a moisturizing ointment to the entire body. This ointment should be a petroleum ointment. This will seal in moisture and help prevent dryness. The thicker the ointment, the better. These should be unscented.   Keep fingernails cut short. Children with eczema may need to wear soft gloves or mittens at night after applying an ointment.   Dress in clothes made of cotton or cotton blends. Dress lightly, because heat increases itching.   A child with eczema should stay away from anyone with fever blisters or cold sores. The virus that causes fever blisters (herpes simplex) can cause a serious skin infection in children with eczema. SEEK MEDICAL CARE IF:   Your itching interferes with sleep.   Your rash gets worse or is not better within 1 week after starting treatment.   You see pus or soft yellow scabs in the rash area.   You have a fever.   You have a rash flare-up after contact with someone who has fever blisters.  Document Released: 03/30/2000 Document Revised: 01/21/2013 Document Reviewed: 11/03/2012 [facility] Patient Information 2014 [employer].   Jock Itch Jock itch is a fungal infection of the skin in the groin area. It is sometimes called "ringworm" even though it is not caused by a worm. A fungus is a type of germ that thrives in dark, damp places.  CAUSES  This infection may spread from:  A fungus infection elsewhere on the body (such as athlete's foot).  Sharing towels or clothing. This infection is more common in:  Hot, humid climates.  People who wear tight-fitting clothing or wet bathing suits for long periods of time.  Athletes.  Overweight people.  People with diabetes. SYMPTOMS  Jock itch causes the following symptoms:  Red, pink or brown rash in the groin. Rash may  spread to the thighs, anus, and buttocks.  Itching. DIAGNOSIS  Your caregiver may make the diagnosis by looking at the rash. Sometimes a skin scraping will be sent to test for fungus. Testing can be done either by looking under the microscope or by doing a culture (test to try to grow the fungus). A culture can take up to 2 weeks to come back. TREATMENT  Jock itch may be treated with:  Skin cream or ointment to kill fungus.  Medicine by mouth to kill fungus.  Skin cream or ointment to calm the itching.  Compresses or medicated powders to dry the infected skin. HOME CARE INSTRUCTIONS   Be sure to treat the rash completely. Follow your caregiver's instructions. It can take a couple of weeks to treat. If you do not treat the infection long enough, the rash can come back.  Wear loose-fitting clothing.  Men should wear cotton boxer shorts.  Women should wear cotton underwear.  Avoid hot baths.  Dry the groin area well after bathing. SEEK MEDICAL CARE IF:   Your rash is worse.  Your rash is spreading.  Your rash returns after treatment is finished.  Your rash is not gone  in 4 weeks. Fungal infections are slow to respond to treatment. Some redness may remain for several weeks after the fungus is gone. SEEK IMMEDIATE MEDICAL CARE IF:  The area becomes red, warm, tender, and swollen.  You have a fever. Document Released: 03/23/2002 Document Revised: 06/25/2011 Document Reviewed: 02/20/2008 [facility] Patient Information 2014 [place], [place].   Onychomycosis/Fungal Toenails  WHAT IS IT? An infection that lies within the keratin of your nail plate that is caused by a fungus.  WHY ME? Fungal infections affect all ages, sexes, races, and creeds.  There may be many factors that predispose you to a fungal infection such as age, coexisting medical conditions such as diabetes, or an autoimmune disease; stress, medications, fatigue, genetics, etc.  Bottom line: fungus thrives in a warm,  moist environment and your shoes offer such a location.  IS IT CONTAGIOUS? Theoretically, yes.  You do not want to share shoes, nail clippers or files with someone who has fungal toenails.  Walking around barefoot in the same room or sleeping in the same bed is unlikely to transfer the organism.  It is important to realize, however, that fungus can spread easily from one nail to the next on the same foot.  HOW DO WE TREAT THIS?  There are several ways to treat this condition.  Treatment may depend on many factors such as age, medications, pregnancy, liver and kidney conditions, etc.  It is best to ask your doctor which options are available to you.  1. No treatment.   Unlike many other medical concerns, you can live with this condition.  However for many people this can be a painful condition and may lead to ingrown toenails or a bacterial infection.  It is recommended that you keep the nails cut short to help reduce the amount of fungal nail. 2. Topical treatment.  These range from herbal remedies to prescription strength nail lacquers.  About 40-50% effective, topicals require twice daily application for approximately 9 to 12 months or until an entirely new nail has grown out.  The most effective topicals are medical grade medications available through physicians offices. 3. Oral antifungal medications.  With an 80-90% cure rate, the most common oral medication requires 3 to 4 months of therapy and stays in your system for a year as the new nail grows out.  Oral antifungal medications do require blood work to make sure it is a safe drug for you.  A liver function panel will be performed prior to starting the medication and after the first month of treatment.  It is important to have the blood work performed to avoid any harmful side effects.  In general, this medication safe but blood work is required. 4. Laser Therapy.  This treatment is performed by applying a specialized laser to the affected nail  plate.  This therapy is noninvasive, fast, and non-painful.  It is not covered by insurance and is therefore, out of pocket.  The results have been very good with a 80-95% cure rate.  The [facility] is the only practice in the area to offer this therapy. 5. Permanent Nail Avulsion.  Removing the entire nail so that a new nail will not grow back.

## 2013-09-10 LAB — COMPREHENSIVE METABOLIC PANEL
ALBUMIN: 4.2 g/dL (ref 3.5–5.2)
ALT: 23 U/L (ref 0–53)
AST: 40 U/L — AB (ref 0–37)
Alkaline Phosphatase: 54 U/L (ref 39–117)
BUN: 15 mg/dL (ref 6–23)
CALCIUM: 9.8 mg/dL (ref 8.4–10.5)
CHLORIDE: 103 meq/L (ref 96–112)
CO2: 26 meq/L (ref 19–32)
CREATININE: 1.05 mg/dL (ref 0.50–1.35)
Glucose, Bld: 102 mg/dL — ABNORMAL HIGH (ref 70–99)
POTASSIUM: 4.3 meq/L (ref 3.5–5.3)
SODIUM: 140 meq/L (ref 135–145)
TOTAL PROTEIN: 7.5 g/dL (ref 6.0–8.3)
Total Bilirubin: 0.6 mg/dL (ref 0.2–1.2)

## 2013-09-14 ENCOUNTER — Encounter: Payer: Self-pay | Admitting: Family Medicine

## 2013-09-17 NOTE — Progress Notes (Signed)
LMVM to CB to schedule appt. 

## 2013-10-16 ENCOUNTER — Ambulatory Visit: Payer: 59 | Admitting: Family Medicine

## 2013-10-16 ENCOUNTER — Ambulatory Visit (INDEPENDENT_AMBULATORY_CARE_PROVIDER_SITE_OTHER): Payer: 59 | Admitting: Family Medicine

## 2013-10-16 ENCOUNTER — Ambulatory Visit: Payer: 59

## 2013-10-16 ENCOUNTER — Encounter: Payer: Self-pay | Admitting: Family Medicine

## 2013-10-16 VITALS — BP 140/84 | HR 95 | Temp 98.3°F | Resp 18 | Ht 74.0 in | Wt 253.8 lb

## 2013-10-16 DIAGNOSIS — Z79899 Other long term (current) drug therapy: Secondary | ICD-10-CM

## 2013-10-16 DIAGNOSIS — L309 Dermatitis, unspecified: Secondary | ICD-10-CM

## 2013-10-16 DIAGNOSIS — M25571 Pain in right ankle and joints of right foot: Secondary | ICD-10-CM

## 2013-10-16 DIAGNOSIS — R748 Abnormal levels of other serum enzymes: Secondary | ICD-10-CM

## 2013-10-16 DIAGNOSIS — B351 Tinea unguium: Secondary | ICD-10-CM

## 2013-10-16 DIAGNOSIS — M25579 Pain in unspecified ankle and joints of unspecified foot: Secondary | ICD-10-CM

## 2013-10-16 DIAGNOSIS — L259 Unspecified contact dermatitis, unspecified cause: Secondary | ICD-10-CM

## 2013-10-16 DIAGNOSIS — F1010 Alcohol abuse, uncomplicated: Secondary | ICD-10-CM

## 2013-10-16 MED ORDER — MELOXICAM 15 MG PO TABS: 15.0000 mg | ORAL_TABLET | Freq: Every day | ORAL | Status: DC

## 2013-10-16 MED ORDER — CLOBETASOL PROPIONATE 0.05 % EX CREA: 1.0000 "application " | TOPICAL_CREAM | Freq: Two times a day (BID) | CUTANEOUS | Status: DC

## 2013-10-16 NOTE — Progress Notes (Signed)
Subjective:  This chart was scribed for Laurey Arrow. Brigitte Pulse, MD  by Stacy Gardner, [facility] Scribe. The patient was seen in room and the patient's care was started at 1:29 PM.  Patient ID: Dakota Johnston, male    DOB: Jul 03, 1975, 38 y.o.   MRN: [id number] Chief Complaint  Patient presents with  . Follow-up    make sure medication is not affecting his liver   HPI HPI Comments: Dakota Johnston is a 38 y.o. male who [facility] the [facility] follow up. He has soreness on the ventral aspect of his foot for the past two months. Denies injury or trauma. The pain is worse when he adducts his ankle or with walking. Denies pain to the touch.   Pt has tinea cruris and onychomycosis and was started on Lotrisone and Lamisil.  His CMP showed a AST of 40. Pt was asked to come back for a repeat examination.  Pt reports medication did not work and the rash on his right lower leg remains unresolved. Pt mentions the rash on his groin has resolved and the rash on his buttock is still present. He reports the discoloration on his toes are changing.   Pt drinks a couple of alcohol drinks every two days and would like help with alcholism.  He will often have a case of beer himself most nights. He explains that he has tried AA in the past and liked it fine but always falls out of going.  Pt mentions that his father was a "drinker".   Patient Active Problem List   Diagnosis Date Noted  . ALLERGIC RHINITIS 01/21/2007  . ASTHMA 01/21/2007   Past Medical History  Diagnosis Date  . Asthma   . Allergy    Past Surgical History  Procedure Laterality Date  . Tumor removal      leg   No Known Allergies Prior to Admission medications   Medication Sig Start Date End Date Taking? Authorizing Provider  clotrimazole-betamethasone (LOTRISONE) cream Apply topically 2 (two) times daily. 09/09/13  Yes Shawnee Knapp, MD  terbinafine (LAMISIL) 250 MG tablet Take 1 tablet (250 mg  total) by mouth daily. NEEDS OV FOR FURTHER REFILLS 09/09/13  Yes Shawnee Knapp, MD  triamcinolone cream (KENALOG) 0.1 % Apply topically 2 (two) times daily. 09/09/13  Yes Shawnee Knapp, MD   History   Social History  . Marital Status: Single    Spouse Name: N/A    Number of Children: N/A  . Years of Education: N/A   Occupational History  . Not on file.   Social History Main Topics  . Smoking status: Current Every Day Smoker -- 0.10 packs/day for 20 years    Types: Cigars  . Smokeless tobacco: Never Used  . Alcohol Use: Yes     Comment: Occasional  . Drug Use: No  . Sexual Activity: Not on file   Other Topics Concern  . Not on file   Social History Narrative  . No narrative on file       Review of Systems  Constitutional: Negative for fever, chills and unexpected weight change.  Cardiovascular: Negative for leg swelling.  Gastrointestinal: Negative for nausea, vomiting, abdominal pain, diarrhea, constipation, blood in stool and abdominal distention.  Musculoskeletal: Positive for arthralgias and myalgias. Negative for gait problem and joint swelling.  Skin: Positive for color change and rash.  Hematological: Negative for adenopathy.  Psychiatric/Behavioral: Positive for behavioral  problems. Negative for sleep disturbance and dysphoric mood.       Objective:   Physical Exam  Nursing note and vitals reviewed. Constitutional: He is oriented to person, place, and time. He appears well-developed and well-nourished. No distress.  HENT:  Head: Normocephalic and atraumatic.  Eyes: Conjunctivae and EOM are normal.  Neck: Neck supple. No tracheal deviation present.  Cardiovascular: Normal rate.   Pulmonary/Chest: Effort normal. No respiratory distress.  Musculoskeletal: Normal range of motion. He exhibits tenderness.    Tenderness of the proximal fourth metatarsal   Neurological: He is alert and oriented to person, place, and time.  Skin: Skin is warm and dry.  Psychiatric: He  has a normal mood and affect. His behavior is normal.    Filed Vitals:   10/16/13 1309  BP: 140/84  Pulse: 95  Temp: 98.3 F (36.8 C)  TempSrc: Oral  Resp: 18  Height: 6\' 2"  (1.88 m)  Weight: 253 lb 12.8 oz (115.123 kg)  SpO2: 98%   DIAGNOSTIC STUDIES: Oxygen Saturation is 98% on room air, normal  by my interpretation.    Primary x-ray reading by Dr. Brigitte Pulse. X-ray reading on right foot is normal.  EXAM: RIGHT FOOT - 2 VIEW  COMPARISON: None.  FINDINGS: AP and lateral views of the right foot reveal the bones to be adequately mineralized. There is no acute or healing fracture nor is there dislocation. There is no significant degenerative change. The overlying soft tissues are normal.  IMPRESSION: There is no acute bony abnormality of the right foot.     Assessment & Plan:   Pain in joint, ankle and foot, right - Plan: DG Foot 2 Views Right, COMPLETE METABOLIC PANEL WITH GFR  Elevated liver enzymes - Plan: COMPLETE METABOLIC PANEL WITH GFR  Encounter for long-term (current) use of other medications - Plan: COMPLETE METABOLIC PANEL WITH GFR  Onychomycosis - check lfts to ensure ok to cont lamisil  Eczema - stronger potency steroid rx'ed since tac only helped a little  Alcohol abuse - advise pt to go back to AA - he agrees, would like to cut down/quit again but does not desire any medication for now - gave phone #s for substance abuse counselors  Meds ordered this encounter  Medications  . clobetasol cream (TEMOVATE) 0.05 %    Sig: Apply 1 application topically 2 (two) times daily.    Dispense:  60 g    Refill:  2  . meloxicam (MOBIC) 15 MG tablet    Sig: Take 1 tablet (15 mg total) by mouth daily.    Dispense:  30 tablet    Refill:  0    I personally performed the services described in this documentation, which was scribed in my presence. The recorded information has been reviewed and considered, and addended by me as needed.  Delman Cheadle, MD MPH

## 2013-10-16 NOTE — Patient Instructions (Addendum)
Go back to AA.  I would also encourage you to consider seeing a counselor. Sometimes a substance abuse counselor could be really helpful in helping to quit drinking. I recommend Wells Guiles at [facility]. I do think that you need to quit drinking alcohol since we are seeing liver irritation.  [address], [place], [place] [address]  [phone]  [facility] -  Address: [address], [place], [place] [address]  [phone]  [facility] Address: [address] [place], [place] [address]  Phone:(336) [place], [place] Phone: [phone]   Your foot xray looks like a little arthritis. Ice the foot twice a day and start daily meloxicam. If you continue to have pain, come back and we may need to think about putting you in a flat-firm shoe or even sending you to a foot doctor if necessary.

## 2013-10-17 LAB — COMPLETE METABOLIC PANEL WITH GFR
ALK PHOS: 49 U/L (ref 39–117)
AST: 21 U/L (ref 0–37)
Albumin: 4.1 g/dL (ref 3.5–5.2)
BILIRUBIN TOTAL: 0.6 mg/dL (ref 0.2–1.2)
BUN: 7 mg/dL (ref 6–23)
CALCIUM: 9.1 mg/dL (ref 8.4–10.5)
CHLORIDE: 108 meq/L (ref 96–112)
CO2: 24 mEq/L (ref 19–32)
CREATININE: 0.98 mg/dL (ref 0.50–1.35)
GFR, Est African American: >89 mL/min
GFR, Est Non African American: >89 mL/min
Glucose, Bld: 93 mg/dL (ref 70–99)
Potassium: 3.7 mEq/L (ref 3.5–5.3)
Sodium: 139 mEq/L (ref 135–145)
Total Protein: 7 g/dL (ref 6.0–8.3)

## 2013-10-23 ENCOUNTER — Encounter: Payer: Self-pay | Admitting: Family Medicine

## 2014-11-05 ENCOUNTER — Ambulatory Visit (INDEPENDENT_AMBULATORY_CARE_PROVIDER_SITE_OTHER): Payer: 59 | Admitting: Family Medicine

## 2014-11-05 VITALS — BP 104/68 | HR 75 | Temp 98.8°F | Resp 16 | Ht 75.25 in | Wt 271.6 lb

## 2014-11-05 DIAGNOSIS — M6740 Ganglion, unspecified site: Secondary | ICD-10-CM

## 2014-11-05 DIAGNOSIS — R21 Rash and other nonspecific skin eruption: Secondary | ICD-10-CM

## 2014-11-05 DIAGNOSIS — Z0000 Encounter for general adult medical examination without abnormal findings: Secondary | ICD-10-CM | POA: Diagnosis not present

## 2014-11-05 LAB — POCT URINALYSIS DIPSTICK
Bilirubin, UA: NEGATIVE
Glucose, UA: NEGATIVE
Ketones, UA: NEGATIVE
Leukocytes, UA: NEGATIVE
Nitrite, UA: NEGATIVE
Protein, UA: NEGATIVE
Spec Grav, UA: 1.025
Urobilinogen, UA: 1
pH, UA: 6.5

## 2014-11-05 LAB — HIV ANTIBODY (ROUTINE TESTING W REFLEX): HIV 1&2 Ab, 4th Generation: NONREACTIVE

## 2014-11-05 LAB — LIPID PANEL
Cholesterol: 177 mg/dL (ref 0–200)
HDL: 44 mg/dL (ref >=40)
LDL Cholesterol: 114 mg/dL — ABNORMAL HIGH (ref 0–99)
Total CHOL/HDL Ratio: 4 Ratio
Triglycerides: 96 mg/dL (ref <150)
VLDL: 19 mg/dL (ref 0–40)

## 2014-11-05 LAB — COMPLETE METABOLIC PANEL WITH GFR
ALT: 9 U/L (ref 0–53)
AST: 22 U/L (ref 0–37)
Albumin: 4.1 g/dL (ref 3.5–5.2)
Alkaline Phosphatase: 52 U/L (ref 39–117)
BUN: 10 mg/dL (ref 6–23)
CO2: 29 mEq/L (ref 19–32)
Calcium: 9.2 mg/dL (ref 8.4–10.5)
Chloride: 103 mEq/L (ref 96–112)
Creat: 0.92 mg/dL (ref 0.50–1.35)
GFR, Est African American: >89 mL/min
GFR, Est Non African American: >89 mL/min
Glucose, Bld: 101 mg/dL — ABNORMAL HIGH (ref 70–99)
Potassium: 4.4 mEq/L (ref 3.5–5.3)
Sodium: 141 mEq/L (ref 135–145)
Total Bilirubin: 0.7 mg/dL (ref 0.2–1.2)
Total Protein: 7.4 g/dL (ref 6.0–8.3)

## 2014-11-05 LAB — POCT CBC
Granulocyte percent: 65.6 %G (ref 37–80)
HCT, POC: 44 % (ref 43.5–53.7)
Hemoglobin: 14.8 g/dL (ref 14.1–18.1)
Lymph, poc: 2.5 (ref 0.6–3.4)
MCH, POC: 30.3 pg (ref 27–31.2)
MCHC: 33.7 g/dL (ref 31.8–35.4)
MCV: 89.8 fL (ref 80–97)
MID (cbc): 0.8 (ref 0–0.9)
MPV: 7.6 fL (ref 0–99.8)
POC Granulocyte: 6.3 (ref 2–6.9)
POC LYMPH PERCENT: 25.9 %L (ref 10–50)
POC MID %: 8.5 %M (ref 0–12)
Platelet Count, POC: 177 10*3/uL (ref 142–424)
RBC: 4.9 M/uL (ref 4.69–6.13)
RDW, POC: 13.8 %
WBC: 9.6 10*3/uL (ref 4.6–10.2)

## 2014-11-05 LAB — RPR

## 2014-11-05 MED ORDER — FLUOCINONIDE-E 0.05 % EX CREA: 1.0000 "application " | TOPICAL_CREAM | Freq: Two times a day (BID) | CUTANEOUS | Status: DC

## 2014-11-05 NOTE — Progress Notes (Signed)
This chart was scribed for Robyn Haber, MD by Moises Blood, medical scribe at [facility].The patient was seen in exam room 3 and the patient's care was started at 10:34 AM.  Patient ID: Dakota Johnston MRN: [id number], DOB: 20-Nov-1975, 39 y.o. Date of Encounter: 11/05/2014  Primary Physician: No primary care provider on file.  Chief Complaint:  Chief Complaint  Patient presents with   Annual Exam   Rash    Right leg, x 1 year    HPI:  Dakota Johnston is a 39 y.o. male who presents to [facility] for annual exam. He denies fatigue and fever.   He also has a rash on his right leg that has been there for about a year. He's been in the clinic before and was given cream but to no relief. It recently started to itch. He denies any spreading of the rash.  He also has a bump on the base of his left thumb that has been there for a while. Lately, it has started to hurt when he bumps into it.   He still works out at [employer] but not as much as he used to.  He used to play football and ran track.   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  He is currently not married.   Past Medical History  Diagnosis Date   Asthma    Allergy      Home Meds: Prior to Admission medications   Medication Sig Start Date End Date Taking? Authorizing Provider  clobetasol cream (TEMOVATE) 7.10 % Apply 1 application topically 2 (two) times daily. Patient not taking: Reported on 11/05/2014 10/16/13   Shawnee Knapp, MD  clotrimazole-betamethasone (LOTRISONE) cream Apply topically 2 (two) times daily. Patient not taking: Reported on 11/05/2014 09/09/13   Shawnee Knapp, MD  meloxicam (MOBIC) 15 MG tablet Take 1 tablet (15 mg total) by mouth daily. Patient not taking: Reported on 11/05/2014 10/16/13   Shawnee Knapp, MD  terbinafine (LAMISIL) 250 MG tablet Take 1 tablet (250 mg total) by mouth daily. NEEDS OV FOR FURTHER REFILLS Patient  not taking: Reported on 11/05/2014 09/09/13   Shawnee Knapp, MD    Allergies: No Known Allergies  History   Social History   Marital Status: Single    Spouse Name: N/A   Number of Children: N/A   Years of Education: N/A   Occupational History   Not on file.   Social History Main Topics   Smoking status: Current Every Day Smoker -- 0.10 packs/day for 20 years    Types: Cigars   Smokeless tobacco: Never Used   Alcohol Use: Yes     Comment: Occasional   Drug Use: No   Sexual Activity: Not on file   Other Topics Concern   Not on file   Social History Narrative     Review of Systems: Constitutional: negative for chills, fever, night sweats, weight changes, or fatigue  HEENT: negative for vision changes, hearing loss, congestion, rhinorrhea, ST, epistaxis, or sinus pressure Cardiovascular: negative for chest pain or palpitations Respiratory: negative for hemoptysis, wheezing, shortness of breath, or cough Abdominal: negative for abdominal pain, nausea, vomiting, diarrhea, or constipation Dermatological: positive for rash (right leg), itchiness (right leg), bump on base of left thumb Neurologic: negative for headache, dizziness, or syncope All other systems reviewed and are otherwise negative with the exception to those above and in the  HPI.  Physical Exam: Blood pressure 104/68, pulse 75, temperature 98.8 F (37.1 C), temperature source Oral, resp. rate 16, height 6' 3.25" (1.911 m), weight 271 lb 9.6 oz (123.197 kg), SpO2 98 %., Body mass index is 33.73 kg/(m^2). General: Well developed, well nourished, in no acute distress. Head: Normocephalic, atraumatic, eyes without discharge, sclera non-icteric, nares are without discharge. Bilateral auditory canals clear, TM's are without perforation, pearly grey and translucent with reflective cone of light bilaterally. Oral cavity moist, posterior pharynx without exudate, erythema, peritonsillar abscess, or post nasal  drip.  Neck: Supple. No thyromegaly. Full ROM. No lymphadenopathy. Lungs: Clear bilaterally to auscultation without wheezes, rales, or rhonchi. Breathing is unlabored. Heart: RRR with S1 S2. No murmurs, rubs, or gallops appreciated. Abdomen: Soft, non-tender, non-distended with normoactive bowel sounds. No hepatomegaly. No rebound/guarding. No obvious abdominal masses. Msk:  Strength and tone normal for age. Extremities/Skin: Warm and dry. No clubbing or cyanosis. No edema. Large ganglion cyst at base of left thumb, right leg hyperpigmented papular rash on lateral aspect of lower right calf Neuro: Alert and oriented X 3. Moves all extremities spontaneously. Gait is normal. CNII-XII grossly in tact. Psych:  Responds to questions appropriately with a normal affect.   Labs: Results for orders placed or performed in visit on 10/16/13  COMPLETE METABOLIC PANEL WITH GFR  Result Value Ref Range   Sodium 139 135 - 145 mEq/L   Potassium 3.7 3.5 - 5.3 mEq/L   Chloride 108 96 - 112 mEq/L   CO2 24 19 - 32 mEq/L   Glucose, Bld 93 70 - 99 mg/dL   BUN 7 6 - 23 mg/dL   Creat 0.98 0.50 - 1.35 mg/dL   Total Bilirubin 0.6 0.2 - 1.2 mg/dL   Alkaline Phosphatase 49 39 - 117 U/L   AST 21 0 - 37 U/L   ALT <8 0 - 53 U/L   Total Protein 7.0 6.0 - 8.3 g/dL   Albumin 4.1 3.5 - 5.2 g/dL   Calcium 9.1 8.4 - 10.5 mg/dL   GFR, Est African American >89 mL/min   GFR, Est Non African American >89 mL/min     ASSESSMENT AND PLAN:  39 y.o. year old male with  This chart was scribed in my presence and reviewed by me personally.    ICD-9-CM ICD-10-CM   1. Annual physical exam V70.0 Z00.00 POCT CBC     POCT urinalysis dipstick     COMPLETE METABOLIC PANEL WITH GFR     Lipid panel     GC/Chlamydia Probe Amp     HIV antibody     RPR  2. Rash and nonspecific skin eruption 782.1 R21 Ambulatory referral to Dermatology     fluocinonide-emollient (LIDEX-E) 0.05 % cream  3. Ganglion cyst 727.43 M67.40 Ambulatory  referral to Hand Surgery     Signed, Robyn Haber, MD 11/05/2014 10:34 AM

## 2014-11-05 NOTE — Patient Instructions (Signed)
Last tetanus was 2013  Health Maintenance A healthy lifestyle and preventative care can promote health and wellness.  Maintain regular health, dental, and eye exams.  Eat a healthy diet. Foods like vegetables, fruits, whole grains, low-fat dairy products, and lean protein foods contain the nutrients you need and are low in calories. Decrease your intake of foods high in solid fats, added sugars, and salt. Get information about a proper diet from your health care provider, if necessary.  Regular physical exercise is one of the most important things you can do for your health. Most adults should get at least 150 minutes of moderate-intensity exercise (any activity that increases your heart rate and causes you to sweat) each week. In addition, most adults need muscle-strengthening exercises on 2 or more days a week.   Maintain a healthy weight. The body mass index (BMI) is a screening tool to identify possible weight problems. It provides an estimate of body fat based on height and weight. Your health care provider can find your BMI and can help you achieve or maintain a healthy weight. For males 20 years and older:  A BMI below 18.5 is considered underweight.  A BMI of 18.5 to 24.9 is normal.  A BMI of 25 to 29.9 is considered overweight.  A BMI of 30 and above is considered obese.  Maintain normal blood lipids and cholesterol by exercising and minimizing your intake of saturated fat. Eat a balanced diet with plenty of fruits and vegetables. Blood tests for lipids and cholesterol should begin at age 55 and be repeated every 5 years. If your lipid or cholesterol levels are high, you are over age 52, or you are at high risk for heart disease, you may need your cholesterol levels checked more frequently.Ongoing high lipid and cholesterol levels should be treated with medicines if diet and exercise are not working.  If you smoke, find out from your health care provider how to quit. If you do not  use tobacco, do not start.  Lung cancer screening is recommended for adults aged 59-80 years who are at high risk for developing lung cancer because of a history of smoking. A yearly low-dose CT scan of the lungs is recommended for people who have at least a 30-pack-year history of smoking and are current smokers or have quit within the past 15 years. A pack year of smoking is smoking an average of 1 pack of cigarettes a day for 1 year (for example, a 30-pack-year history of smoking could mean smoking 1 pack a day for 30 years or 2 packs a day for 15 years). Yearly screening should continue until the smoker has stopped smoking for at least 15 years. Yearly screening should be stopped for people who develop a health problem that would prevent them from having lung cancer treatment.  If you choose to drink alcohol, do not have more than 2 drinks per day. One drink is considered to be 12 oz (360 mL) of beer, 5 oz (150 mL) of wine, or 1.5 oz (45 mL) of liquor.  Avoid the use of street drugs. Do not share needles with anyone. Ask for help if you need support or instructions about stopping the use of drugs.  High blood pressure causes heart disease and increases the risk of stroke. Blood pressure should be checked at least every 1-2 years. Ongoing high blood pressure should be treated with medicines if weight loss and exercise are not effective.  If you are 70-76 years old, ask  your health care provider if you should take aspirin to prevent heart disease.  Diabetes screening involves taking a blood sample to check your fasting blood sugar level. This should be done once every 3 years after age 60 if you are at a normal weight and without risk factors for diabetes. Testing should be considered at a younger age or be carried out more frequently if you are overweight and have at least 1 risk factor for diabetes.  Colorectal cancer can be detected and often prevented. Most routine colorectal cancer screening  begins at the age of 75 and continues through age 47. However, your health care provider may recommend screening at an earlier age if you have risk factors for colon cancer. On a yearly basis, your health care provider may provide home test kits to check for hidden blood in the stool. A small camera at the end of a tube may be used to directly examine the colon (sigmoidoscopy or colonoscopy) to detect the earliest forms of colorectal cancer. Talk to your health care provider about this at age 67 when routine screening begins. A direct exam of the colon should be repeated every 5-10 years through age 96, unless early forms of precancerous polyps or small growths are found.  People who are at an increased risk for hepatitis B should be screened for this virus. You are considered at high risk for hepatitis B if:  You were born in a country where hepatitis B occurs often. Talk with your health care provider about which countries are considered high risk.  Your parents were born in a high-risk country and you have not received a shot to protect against hepatitis B (hepatitis B vaccine).  You have HIV or AIDS.  You use needles to inject street drugs.  You live with, or have sex with, someone who has hepatitis B.  You are a man who has sex with other men (MSM).  You get hemodialysis treatment.  You take certain medicines for conditions like cancer, organ transplantation, and autoimmune conditions.  Hepatitis C blood testing is recommended for all people born from [birth year] through 1965 and any individual with known risk factors for hepatitis C.  Healthy men should no longer receive prostate-specific antigen (PSA) blood tests as part of routine cancer screening. Talk to your health care provider about prostate cancer screening.  Testicular cancer screening is not recommended for adolescents or adult males who have no symptoms. Screening includes self-exam, a health care provider exam, and other screening  tests. Consult with your health care provider about any symptoms you have or any concerns you have about testicular cancer.  Practice safe sex. Use condoms and avoid high-risk sexual practices to reduce the spread of sexually transmitted infections (STIs).  You should be screened for STIs, including gonorrhea and chlamydia if:  You are sexually active and are younger than 24 years.  You are older than 24 years, and your health care provider tells you that you are at risk for this type of infection.  Your sexual activity has changed since you were last screened, and you are at an increased risk for chlamydia or gonorrhea. Ask your health care provider if you are at risk.  If you are at risk of being infected with HIV, it is recommended that you take a prescription medicine daily to prevent HIV infection. This is called pre-exposure prophylaxis (PrEP). You are considered at risk if:  You are a man who has sex with other men (MSM).  You  are a heterosexual man who is sexually active with multiple partners.  You take drugs by injection.  You are sexually active with a partner who has HIV.  Talk with your health care provider about whether you are at high risk of being infected with HIV. If you choose to begin PrEP, you should first be tested for HIV. You should then be tested every 3 months for as long as you are taking PrEP.  Use sunscreen. Apply sunscreen liberally and repeatedly throughout the day. You should seek shade when your shadow is shorter than you. Protect yourself by wearing long sleeves, pants, a wide-brimmed hat, and sunglasses year round whenever you are outdoors.  Tell your health care provider of new moles or changes in moles, especially if there is a change in shape or color. Also, tell your health care provider if a mole is larger than the size of a pencil eraser.  A one-time screening for abdominal aortic aneurysm (AAA) and surgical repair of large AAAs by ultrasound is  recommended for men aged 75-75 years who are current or former smokers.  Stay current with your vaccines (immunizations). Document Released: 09/29/2007 Document Revised: 04/07/2013 Document Reviewed: 08/28/2010 [facility] Patient Information 2015 [place], [place]. This information is not intended to replace advice given to you by your health care provider. Make sure you discuss any questions you have with your health care provider.

## 2014-11-06 LAB — GC/CHLAMYDIA PROBE AMP
CT Probe RNA: NEGATIVE
GC Probe RNA: NEGATIVE

## 2014-12-15 ENCOUNTER — Other Ambulatory Visit: Payer: Self-pay | Admitting: Orthopedic Surgery

## 2014-12-27 ENCOUNTER — Ambulatory Visit (HOSPITAL_BASED_OUTPATIENT_CLINIC_OR_DEPARTMENT_OTHER): Admit: 2014-12-27 | Payer: Self-pay | Admitting: Orthopedic Surgery

## 2014-12-27 ENCOUNTER — Encounter (HOSPITAL_BASED_OUTPATIENT_CLINIC_OR_DEPARTMENT_OTHER): Payer: Self-pay

## 2014-12-27 SURGERY — EXCISION METACARPAL MASS
Anesthesia: General | Site: Thumb | Laterality: Left

## 2015-01-21 ENCOUNTER — Other Ambulatory Visit: Payer: Self-pay | Admitting: Family Medicine

## 2015-01-24 ENCOUNTER — Telehealth: Payer: Self-pay | Admitting: *Deleted

## 2015-01-24 NOTE — Telephone Encounter (Signed)
Spoke with patient he states he does not need this cream.  Dermatology gave him different medication.

## 2015-04-17 HISTORY — PX: WISDOM TOOTH EXTRACTION: SHX21

## 2016-06-14 ENCOUNTER — Encounter: Payer: Self-pay | Admitting: Emergency Medicine

## 2016-06-14 ENCOUNTER — Ambulatory Visit (INDEPENDENT_AMBULATORY_CARE_PROVIDER_SITE_OTHER): Payer: 59 | Admitting: Emergency Medicine

## 2016-06-14 VITALS — BP 108/84 | HR 98 | Temp 98.6°F | Resp 16 | Ht 74.0 in | Wt 289.6 lb

## 2016-06-14 DIAGNOSIS — Z0000 Encounter for general adult medical examination without abnormal findings: Secondary | ICD-10-CM

## 2016-06-14 NOTE — Patient Instructions (Addendum)
   IF you received an x-ray today, you will receive an invoice from [facility]. Please contact [facility] at [phone] with questions or concerns regarding your invoice.   IF you received labwork today, you will receive an invoice from [facility]. Please contact [facility] at [phone] with questions or concerns regarding your invoice.   Our billing staff will not be able to assist you with questions regarding bills from these companies.  You will be contacted with the lab results as soon as they are available. The fastest way to get your results is to activate your My Chart account. Instructions are located on the last page of this paperwork. If you have not heard from us regarding the results in 2 weeks, please contact this office.        Health Maintenance, Male A healthy lifestyle and preventive care is important for your health and wellness. Ask your health care provider about what schedule of regular examinations is right for you. What should I know about weight and diet?  Eat a Healthy Diet  Eat plenty of vegetables, fruits, whole grains, low-fat dairy products, and lean protein.  Do not eat a lot of foods high in solid fats, added sugars, or salt. Maintain a Healthy Weight  Regular exercise can help you achieve or maintain a healthy weight. You should:  Do at least 150 minutes of exercise each week. The exercise should increase your heart rate and make you sweat (moderate-intensity exercise).  Do strength-training exercises at least twice a week. Watch Your Levels of Cholesterol and Blood Lipids  Have your blood tested for lipids and cholesterol every 5 years starting at 43 years of age. If you are at high risk for heart disease, you should start having your blood tested when you are 43 years old. You may need to have your cholesterol levels checked more often if:  Your lipid or cholesterol levels are high.  You are older than 43 years of  age.  You are at high risk for heart disease. What should I know about cancer screening? Many types of cancers can be detected early and may often be prevented. Lung Cancer  You should be screened every year for lung cancer if:  You are a current smoker who has smoked for at least 30 years.  You are a former smoker who has quit within the past 15 years.  Talk to your health care provider about your screening options, when you should start screening, and how often you should be screened. Colorectal Cancer  Routine colorectal cancer screening usually begins at 43 years of age and should be repeated every 5-10 years until you are 43 years old. You may need to be screened more often if early forms of precancerous polyps or small growths are found. Your health care provider may recommend screening at an earlier age if you have risk factors for colon cancer.  Your health care provider may recommend using home test kits to check for hidden blood in the stool.  A small camera at the end of a tube can be used to examine your colon (sigmoidoscopy or colonoscopy). This checks for the earliest forms of colorectal cancer. Prostate and Testicular Cancer  Depending on your age and overall health, your health care provider may do certain tests to screen for prostate and testicular cancer.  Talk to your health care provider about any symptoms or concerns you have about testicular or prostate cancer. Skin Cancer  Check your skin from head   to toe regularly.  Tell your health care provider about any new moles or changes in moles, especially if:  There is a change in a mole's size, shape, or color.  You have a mole that is larger than a pencil eraser.  Always use sunscreen. Apply sunscreen liberally and repeat throughout the day.  Protect yourself by wearing long sleeves, pants, a wide-brimmed hat, and sunglasses when outside. What should I know about heart disease, diabetes, and high blood  pressure?  If you are 18-39 years of age, have your blood pressure checked every 3-5 years. If you are 40 years of age or older, have your blood pressure checked every year. You should have your blood pressure measured twice-once when you are at a hospital or clinic, and once when you are not at a hospital or clinic. Record the average of the two measurements. To check your blood pressure when you are not at a hospital or clinic, you can use:  An automated blood pressure machine at a pharmacy.  A home blood pressure monitor.  Talk to your health care provider about your target blood pressure.  If you are between 45-79 years old, ask your health care provider if you should take aspirin to prevent heart disease.  Have regular diabetes screenings by checking your fasting blood sugar level.  If you are at a normal weight and have a low risk for diabetes, have this test once every three years after the age of 45.  If you are overweight and have a high risk for diabetes, consider being tested at a younger age or more often.  A one-time screening for abdominal aortic aneurysm (AAA) by ultrasound is recommended for men aged 65-75 years who are current or former smokers. What should I know about preventing infection? Hepatitis B  If you have a higher risk for hepatitis B, you should be screened for this virus. Talk with your health care provider to find out if you are at risk for hepatitis B infection. Hepatitis C  Blood testing is recommended for:  Everyone born from 1945 through 1965.  Anyone with known risk factors for hepatitis C. Sexually Transmitted Diseases (STDs)  You should be screened each year for STDs including gonorrhea and chlamydia if:  You are sexually active and are younger than 43 years of age.  You are older than 43 years of age and your health care provider tells you that you are at risk for this type of infection.  Your sexual activity has changed since you were last  screened and you are at an increased risk for chlamydia or gonorrhea. Ask your health care provider if you are at risk.  Talk with your health care provider about whether you are at high risk of being infected with HIV. Your health care provider may recommend a prescription medicine to help prevent HIV infection. What else can I do?  Schedule regular health, dental, and eye exams.  Stay current with your vaccines (immunizations).  Do not use any tobacco products, such as cigarettes, chewing tobacco, and e-cigarettes. If you need help quitting, ask your health care provider.  Limit alcohol intake to no more than 2 drinks per day. One drink equals 12 ounces of beer, 5 ounces of wine, or 1 ounces of hard liquor.  Do not use street drugs.  Do not share needles.  Ask your health care provider for help if you need support or information about quitting drugs.  Tell your health care provider if you   often feel depressed.  Tell your health care provider if you have ever been abused or do not feel safe at home. This information is not intended to replace advice given to you by your health care provider. Make sure you discuss any questions you have with your health care provider. Document Released: 09/29/2007 Document Revised: 11/30/2015 Document Reviewed: 01/04/2015 [employer] Interactive Patient Education  2017 [employer]  American Heart Association (AHA) Exercise Recommendation  Being physically active is important to prevent heart disease and stroke, the nation's No. 1and No. 5killers. To improve overall cardiovascular health, we suggest at least 150 minutes per week of moderate exercise or 75 minutes per week of vigorous exercise (or a combination of moderate and vigorous activity). Thirty minutes a day, five times a week is an easy goal to remember. You will also experience benefits even if you divide your time into two or three segments of 10 to 15 minutes per day.  For people who would  benefit from lowering their blood pressure or cholesterol, we recommend 40 minutes of aerobic exercise of moderate to vigorous intensity three to four times a week to lower the risk for heart attack and stroke.  Physical activity is anything that makes you move your body and burn calories.  This includes things like climbing stairs or playing sports. Aerobic exercises benefit your heart, and include walking, jogging, swimming or biking. Strength and stretching exercises are best for overall stamina and flexibility.  The simplest, positive change you can make to effectively improve your heart health is to start walking. It's enjoyable, free, easy, social and great exercise. A walking program is flexible and boasts high success rates because people can stick with it. It's easy for walking to become a regular and satisfying part of life.   For Overall Cardiovascular Health:  At least 30 minutes of moderate-intensity aerobic activity at least 5 days per week for a total of 150  OR   At least 25 minutes of vigorous aerobic activity at least 3 days per week for a total of 75 minutes; or a combination of moderate- and vigorous-intensity aerobic activity  AND   Moderate- to high-intensity muscle-strengthening activity at least 2 days per week for additional health benefits.  For Lowering Blood Pressure and Cholesterol  An average 40 minutes of moderate- to vigorous-intensity aerobic activity 3 or 4 times per week  What if I can't make it to the time goal? Something is always better than nothing! And everyone has to start somewhere. Even if you've been sedentary for years, today is the day you can begin to make healthy changes in your life. If you don't think you'll make it for 30 or 40 minutes, set a reachable goal for today. You can work up toward your overall goal by increasing your time as you get stronger. Don't let all-or-nothing thinking rob you of doing what you can every day.   Source:http://www.heart.org    

## 2016-06-14 NOTE — Progress Notes (Signed)
Dakota Johnston 41 y.o.   Chief Complaint  Patient presents with  . Annual Exam    WITH FORM TO COMPLETE    HISTORY OF PRESENT ILLNESS: This is a 41 y.o. male here today for annual exam. No complaints.  HPI   Prior to Admission medications   Medication Sig Start Date End Date Taking? Authorizing Provider  clobetasol cream (TEMOVATE) [id number] % APPLY 1 APPLICATION TWICE A DAY 01/24/15  Yes Robyn Haber, MD  clotrimazole-betamethasone (LOTRISONE) cream Apply 1 application topically daily.   Yes Historical Provider, MD  fluocinonide-emollient (LIDEX-E) 0.05 % cream Apply 1 application topically 2 (two) times daily. Patient not taking: Reported on 06/14/2016 11/05/14   Robyn Haber, MD    No Known Allergies  Patient Active Problem List   Diagnosis Date Noted  . ALLERGIC RHINITIS 01/21/2007  . ASTHMA 01/21/2007    Past Medical History:  Diagnosis Date  . Allergy   . Asthma     Past Surgical History:  Procedure Laterality Date  . TUMOR REMOVAL     leg  . TUMOR REMOVAL      Social History   Social History  . Marital status: Single    Spouse name: N/A  . Number of children: N/A  . Years of education: N/A   Occupational History  . Not on file.   Social History Main Topics  . Smoking status: Current Every Day Smoker    Packs/day: 0.10    Years: 20.00    Types: Cigars  . Smokeless tobacco: Never Used  . Alcohol use Yes     Comment: Occasional  . Drug use: No  . Sexual activity: Not on file   Other Topics Concern  . Not on file   Social History Narrative  . No narrative on file    Family History  Problem Relation Age of Onset  . Diabetes Father   . Diabetes Paternal Grandmother      Review of Systems  Constitutional: Negative.   HENT: Negative.   Eyes: Negative.   Respiratory: Negative.   Cardiovascular: Negative.   Gastrointestinal: Negative.   Genitourinary: Negative.   Musculoskeletal: Negative.   Skin: Negative.   Neurological:  Negative.   Endo/Heme/Allergies: Negative.   All other systems reviewed and are negative.  Vitals:   06/14/16 1215  BP: 108/84  Pulse: 98  Resp: 16  Temp: 98.6 F (37 C)     Physical Exam  Constitutional: He is oriented to person, place, and time. He appears well-developed and well-nourished.  HENT:  Head: Normocephalic and atraumatic.  Nose: Nose normal.  Mouth/Throat: Oropharynx is clear and moist. No oropharyngeal exudate.  Eyes: Conjunctivae and EOM are normal. Pupils are equal, round, and reactive to light.  Neck: Normal range of motion. Neck supple. No JVD present. No thyromegaly present.  Cardiovascular: Normal rate, regular rhythm, normal heart sounds and intact distal pulses.   Pulmonary/Chest: Effort normal and breath sounds normal.  Abdominal: Soft. Bowel sounds are normal. He exhibits no distension. There is no tenderness.  Musculoskeletal: Normal range of motion.  Lymphadenopathy:    He has no cervical adenopathy.  Neurological: He is alert and oriented to person, place, and time. He displays normal reflexes. No sensory deficit. He exhibits normal muscle tone.  Skin: Skin is warm and dry. Capillary refill takes less than 2 seconds.  Psychiatric: He has a normal mood and affect. His behavior is normal.  Vitals reviewed.    ASSESSMENT & PLAN: Dakota Johnston was seen today  for annual exam.  Diagnoses and all orders for this visit:  Routine general medical examination at a health care facility -     CBC with Differential -     Comprehensive metabolic panel -     Hemoglobin A1c -     Lipid panel -     PSA(Must document that pt has been informed of limitations of PSA testing.) -     TSH     Patient Instructions       IF you received an x-ray today, you will receive an invoice from [facility]. Please contact [facility] at [phone] with questions or concerns regarding your invoice.   IF you received labwork today, you will receive an  invoice from [facility]. Please contact [facility] at [phone] with questions or concerns regarding your invoice.   Our billing staff will not be able to assist you with questions regarding bills from these companies.  You will be contacted with the lab results as soon as they are available. The fastest way to get your results is to activate your My Chart account. Instructions are located on the last page of this paperwork. If you have not heard from [place] regarding the results in 2 weeks, please contact this office.        Health Maintenance, Male A healthy lifestyle and preventive care is important for your health and wellness. Ask your health care provider about what schedule of regular examinations is right for you. What should I know about weight and diet?  Eat a Healthy Diet  Eat plenty of vegetables, fruits, whole grains, low-fat dairy products, and lean protein.  Do not eat a lot of foods high in solid fats, added sugars, or salt. Maintain a Healthy Weight  Regular exercise can help you achieve or maintain a healthy weight. You should:  Do at least 150 minutes of exercise each week. The exercise should increase your heart rate and make you sweat (moderate-intensity exercise).  Do strength-training exercises at least twice a week. Watch Your Levels of Cholesterol and Blood Lipids  Have your blood tested for lipids and cholesterol every 5 years starting at 43 years of age. If you are at high risk for heart disease, you should start having your blood tested when you are 43 years old. You may need to have your cholesterol levels checked more often if:  Your lipid or cholesterol levels are high.  You are older than 43 years of age.  You are at high risk for heart disease. What should I know about cancer screening? Many types of cancers can be detected early and may often be prevented. Lung Cancer  You should be screened every year for lung cancer if:  You are a current smoker  who has smoked for at least 30 years.  You are a former smoker who has quit within the past 15 years.  Talk to your health care provider about your screening options, when you should start screening, and how often you should be screened. Colorectal Cancer  Routine colorectal cancer screening usually begins at 43 years of age and should be repeated every 5-10 years until you are 43 years old. You may need to be screened more often if early forms of precancerous polyps or small growths are found. Your health care provider may recommend screening at an earlier age if you have risk factors for colon cancer.  Your health care provider may recommend using home test kits to check for hidden blood in the  stool.  A small camera at the end of a tube can be used to examine your colon (sigmoidoscopy or colonoscopy). This checks for the earliest forms of colorectal cancer. Prostate and Testicular Cancer  Depending on your age and overall health, your health care provider may do certain tests to screen for prostate and testicular cancer.  Talk to your health care provider about any symptoms or concerns you have about testicular or prostate cancer. Skin Cancer  Check your skin from head to toe regularly.  Tell your health care provider about any new moles or changes in moles, especially if:  There is a change in a mole's size, shape, or color.  You have a mole that is larger than a pencil eraser.  Always use sunscreen. Apply sunscreen liberally and repeat throughout the day.  Protect yourself by wearing long sleeves, pants, a wide-brimmed hat, and sunglasses when outside. What should I know about heart disease, diabetes, and high blood pressure?  If you are 52-70 years of age, have your blood pressure checked every 3-5 years. If you are 8 years of age or older, have your blood pressure checked every year. You should have your blood pressure measured twice-once when you are at a hospital or clinic,  and once when you are not at a hospital or clinic. Record the average of the two measurements. To check your blood pressure when you are not at a hospital or clinic, you can use:  An automated blood pressure machine at a pharmacy.  A home blood pressure monitor.  Talk to your health care provider about your target blood pressure.  If you are between 17-18 years old, ask your health care provider if you should take aspirin to prevent heart disease.  Have regular diabetes screenings by checking your fasting blood sugar level.  If you are at a normal weight and have a low risk for diabetes, have this test once every three years after the age of 23.  If you are overweight and have a high risk for diabetes, consider being tested at a younger age or more often.  A one-time screening for abdominal aortic aneurysm (AAA) by ultrasound is recommended for men aged 46-75 years who are current or former smokers. What should I know about preventing infection? Hepatitis B  If you have a higher risk for hepatitis B, you should be screened for this virus. Talk with your health care provider to find out if you are at risk for hepatitis B infection. Hepatitis C  Blood testing is recommended for:  Everyone born from [birth year] through 1967.  Anyone with known risk factors for hepatitis C. Sexually Transmitted Diseases (STDs)  You should be screened each year for STDs including gonorrhea and chlamydia if:  You are sexually active and are younger than 43 years of age.  You are older than 43 years of age and your health care provider tells you that you are at risk for this type of infection.  Your sexual activity has changed since you were last screened and you are at an increased risk for chlamydia or gonorrhea. Ask your health care provider if you are at risk.  Talk with your health care provider about whether you are at high risk of being infected with HIV. Your health care provider may recommend a  prescription medicine to help prevent HIV infection. What else can I do?  Schedule regular health, dental, and eye exams.  Stay current with your vaccines (immunizations).  Do not use any  tobacco products, such as cigarettes, chewing tobacco, and e-cigarettes. If you need help quitting, ask your health care provider.  Limit alcohol intake to no more than 2 drinks per day. One drink equals 12 ounces of beer, 5 ounces of wine, or 1 ounces of hard liquor.  Do not use street drugs.  Do not share needles.  Ask your health care provider for help if you need support or information about quitting drugs.  Tell your health care provider if you often feel depressed.  Tell your health care provider if you have ever been abused or do not feel safe at home. This information is not intended to replace advice given to you by your health care provider. Make sure you discuss any questions you have with your health care provider. Document Released: 09/29/2007 Document Revised: 11/30/2015 Document Reviewed: 01/04/2015 Elsevier Interactive Patient Education  2017 [facility]) Exercise Recommendation  Being physically active is important to prevent heart disease and stroke, the nation's No. 1and No. 5killers. To improve overall cardiovascular health, we suggest at least 150 minutes per week of moderate exercise or 75 minutes per week of vigorous exercise (or a combination of moderate and vigorous activity). Thirty minutes a day, five times a week is an easy goal to remember. You will also experience benefits even if you divide your time into two or three segments of 10 to 15 minutes per day.  For people who would benefit from lowering their blood pressure or cholesterol, we recommend 40 minutes of aerobic exercise of moderate to vigorous intensity three to four times a week to lower the risk for heart attack and stroke.  Physical activity is anything that makes you move  your body and burn calories.  This includes things like climbing stairs or playing sports. Aerobic exercises benefit your heart, and include walking, jogging, swimming or biking. Strength and stretching exercises are best for overall stamina and flexibility.  The simplest, positive change you can make to effectively improve your heart health is to start walking. It's enjoyable, free, easy, social and great exercise. A walking program is flexible and boasts high success rates because people can stick with it. It's easy for walking to become a regular and satisfying part of life.   For Overall Cardiovascular Health:  At least 30 minutes of moderate-intensity aerobic activity at least 5 days per week for a total of 150  OR   At least 25 minutes of vigorous aerobic activity at least 3 days per week for a total of 75 minutes; or a combination of moderate- and vigorous-intensity aerobic activity  AND   Moderate- to high-intensity muscle-strengthening activity at least 2 days per week for additional health benefits.  For Lowering Blood Pressure and Cholesterol  An average 40 minutes of moderate- to vigorous-intensity aerobic activity 3 or 4 times per week  What if I can't make it to the time goal? Something is always better than nothing! And everyone has to start somewhere. Even if you've been sedentary for years, today is the day you can begin to make healthy changes in your life. If you don't think you'll make it for 30 or 40 minutes, set a reachable goal for today. You can work up toward your overall goal by increasing your time as you get stronger. Don't let all-or-nothing thinking rob you of doing what you can every day.  Source:[email or web address], MD [facility]

## 2016-06-15 LAB — CBC WITH DIFFERENTIAL/PLATELET
Basophils Absolute: 0 10*3/uL (ref 0.0–0.2)
Basos: 0 %
EOS (ABSOLUTE): 0.2 10*3/uL (ref 0.0–0.4)
Eos: 2 %
Hematocrit: 41.1 % (ref 37.5–51.0)
Hemoglobin: 14.3 g/dL (ref 13.0–17.7)
IMMATURE GRANS (ABS): 0 10*3/uL (ref 0.0–0.1)
IMMATURE GRANULOCYTES: 0 %
LYMPHS: 24 %
Lymphocytes Absolute: 2.5 10*3/uL (ref 0.7–3.1)
MCH: 31.2 pg (ref 26.6–33.0)
MCHC: 34.8 g/dL (ref 31.5–35.7)
MCV: 90 fL (ref 79–97)
MONOS ABS: 1.1 10*3/uL — AB (ref 0.1–0.9)
Monocytes: 11 %
NEUTROS ABS: 6.6 10*3/uL (ref 1.4–7.0)
NEUTROS PCT: 63 %
PLATELETS: 202 10*3/uL (ref 150–379)
RBC: 4.59 x10E6/uL (ref 4.14–5.80)
RDW: 14.5 % (ref 12.3–15.4)
WBC: 10.5 10*3/uL (ref 3.4–10.8)

## 2016-06-15 LAB — LIPID PANEL
CHOL/HDL RATIO: 4.7 ratio (ref 0.0–5.0)
Cholesterol, Total: 170 mg/dL (ref 100–199)
HDL: 36 mg/dL — AB (ref >39)
LDL Calculated: 112 mg/dL — ABNORMAL HIGH (ref 0–99)
Triglycerides: 109 mg/dL (ref 0–149)
VLDL CHOLESTEROL CAL: 22 mg/dL (ref 5–40)

## 2016-06-15 LAB — COMPREHENSIVE METABOLIC PANEL
ALBUMIN: 4.4 g/dL (ref 3.5–5.5)
ALT: 16 IU/L (ref 0–44)
AST: 22 IU/L (ref 0–40)
Albumin/Globulin Ratio: 1.4 (ref 1.2–2.2)
Alkaline Phosphatase: 68 IU/L (ref 39–117)
BUN / CREAT RATIO: 11 (ref 9–20)
BUN: 11 mg/dL (ref 6–24)
Bilirubin Total: 0.6 mg/dL (ref 0.0–1.2)
CALCIUM: 9.7 mg/dL (ref 8.7–10.2)
CO2: 25 mmol/L (ref 18–29)
CREATININE: 0.99 mg/dL (ref 0.76–1.27)
Chloride: 102 mmol/L (ref 96–106)
GFR, EST AFRICAN AMERICAN: 108 mL/min/{1.73_m2} (ref >59)
GFR, EST NON AFRICAN AMERICAN: 94 mL/min/{1.73_m2} (ref >59)
GLOBULIN, TOTAL: 3.1 g/dL (ref 1.5–4.5)
Glucose: 92 mg/dL (ref 65–99)
Potassium: 4.3 mmol/L (ref 3.5–5.2)
Sodium: 144 mmol/L (ref 134–144)
TOTAL PROTEIN: 7.5 g/dL (ref 6.0–8.5)

## 2016-06-15 LAB — HEMOGLOBIN A1C
Est. average glucose Bld gHb Est-mCnc: 120 mg/dL
Hgb A1c MFr Bld: 5.8 % — ABNORMAL HIGH (ref 4.8–5.6)

## 2016-06-15 LAB — PSA: PROSTATE SPECIFIC AG, SERUM: 0.5 ng/mL (ref 0.0–4.0)

## 2016-06-15 LAB — TSH: TSH: 0.967 u[IU]/mL (ref 0.450–4.500)

## 2016-07-21 ENCOUNTER — Ambulatory Visit: Payer: 59

## 2016-11-01 DIAGNOSIS — L438 Other lichen planus: Secondary | ICD-10-CM | POA: Diagnosis not present

## 2016-11-01 DIAGNOSIS — B9689 Other specified bacterial agents as the cause of diseases classified elsewhere: Secondary | ICD-10-CM | POA: Diagnosis not present

## 2016-11-01 DIAGNOSIS — L02426 Furuncle of left lower limb: Secondary | ICD-10-CM | POA: Diagnosis not present

## 2016-11-06 ENCOUNTER — Ambulatory Visit (INDEPENDENT_AMBULATORY_CARE_PROVIDER_SITE_OTHER): Payer: 59

## 2016-11-06 ENCOUNTER — Ambulatory Visit (INDEPENDENT_AMBULATORY_CARE_PROVIDER_SITE_OTHER): Payer: 59 | Admitting: Family Medicine

## 2016-11-06 ENCOUNTER — Encounter: Payer: Self-pay | Admitting: Family Medicine

## 2016-11-06 VITALS — BP 134/83 | HR 77 | Temp 98.6°F | Resp 16 | Ht 73.5 in | Wt 263.0 lb

## 2016-11-06 DIAGNOSIS — J22 Unspecified acute lower respiratory infection: Secondary | ICD-10-CM | POA: Diagnosis not present

## 2016-11-06 DIAGNOSIS — J309 Allergic rhinitis, unspecified: Secondary | ICD-10-CM | POA: Diagnosis not present

## 2016-11-06 DIAGNOSIS — R059 Cough, unspecified: Secondary | ICD-10-CM

## 2016-11-06 DIAGNOSIS — J4520 Mild intermittent asthma, uncomplicated: Secondary | ICD-10-CM | POA: Diagnosis not present

## 2016-11-06 DIAGNOSIS — R05 Cough: Secondary | ICD-10-CM

## 2016-11-06 MED ORDER — AZITHROMYCIN 250 MG PO TABS: ORAL_TABLET | ORAL | 0 refills | Status: DC

## 2016-11-06 MED ORDER — ALBUTEROL SULFATE HFA 108 (90 BASE) MCG/ACT IN AERS: 1.0000 | INHALATION_SPRAY | RESPIRATORY_TRACT | 0 refills | Status: AC | PRN

## 2016-11-06 MED ORDER — BECLOMETHASONE DIPROPIONATE 80 MCG/ACT IN AERS: 1.0000 | INHALATION_SPRAY | Freq: Two times a day (BID) | RESPIRATORY_TRACT | 3 refills | Status: DC

## 2016-11-06 NOTE — Progress Notes (Signed)
Subjective:  By signing my name below, I, Dakota Johnston, attest that this documentation has been prepared under the direction and in the presence of Dakota Agreste, MD Electronically Signed: Ladene Johnston, ED Scribe 11/06/2016 at 3:30 PM.   Patient ID: Dakota Johnston, male    DOB: May 29, 1975, 41 y.o.   MRN: [id number]  Chief Complaint  Patient presents with  . Cough    congestion, productive yellow/green cough x1 month   HPI Dakota Johnston is a 41 y.o. male who presents to Primary Care at [facility] for cough and congestion. H/o asthma. Pt states he recently started a new job at [employer] 3 months ago in a dusty environment. He reports nasal and chest congestion, wheezing, chest tightness, productive cough with thick yellow phlegm x 1 month. He is taking Zyrtec daily and recently started Afrin x 1-2 every other day. Pt denies fever. He has not needed any inhalers or Prednisone for several years for asthma. Has been having some wheezing recently - past month. Denies chest pain.  Patient Active Problem List   Diagnosis Date Noted  . ALLERGIC RHINITIS 01/21/2007  . ASTHMA 01/21/2007   Past Medical History:  Diagnosis Date  . Allergy   . Asthma    Past Surgical History:  Procedure Laterality Date  . TUMOR REMOVAL     leg  . TUMOR REMOVAL     No Known Allergies Prior to Admission medications   Medication Sig Start Date End Date Taking? Authorizing Provider  clobetasol cream (TEMOVATE) 9.38 % APPLY 1 APPLICATION TWICE A DAY 01/24/15   Robyn Haber, MD  clotrimazole-betamethasone (LOTRISONE) cream Apply 1 application topically daily.    [provider]  fluocinonide-emollient (LIDEX-E) 0.05 % cream Apply 1 application topically 2 (two) times daily. Patient not taking: Reported on 06/14/2016 11/05/14   Robyn Haber, MD   Social History   Social History  . Marital status: Single    Spouse name: N/A  . Number of children: N/A  . Years of education: N/A   Occupational  History  . Not on file.   Social History Main Topics  . Smoking status: Current Every Day Smoker    Packs/day: 0.10    Years: 20.00    Types: Cigars  . Smokeless tobacco: Never Used  . Alcohol use Yes     Comment: Occasional  . Drug use: No  . Sexual activity: Not on file   Other Topics Concern  . Not on file   Social History Narrative  . No narrative on file   Review of Systems  Constitutional: Negative for fever.  HENT: Positive for congestion.   Respiratory: Positive for cough, chest tightness and wheezing.       Objective:   Physical Exam  Constitutional: He is oriented to person, place, and time. He appears well-developed and well-nourished.  HENT:  Head: Normocephalic and atraumatic.  Right Ear: Tympanic membrane, external ear and ear canal normal.  Left Ear: Tympanic membrane, external ear and ear canal normal.  Nose: No rhinorrhea. Right sinus exhibits no maxillary sinus tenderness and no frontal sinus tenderness. Left sinus exhibits no maxillary sinus tenderness and no frontal sinus tenderness.  Mouth/Throat: Oropharynx is clear and moist and mucous membranes are normal. No oropharyngeal exudate or posterior oropharyngeal erythema.  Slight pale boggy turbinates. No active discharge.  Eyes: Pupils are equal, round, and reactive to light. Conjunctivae are normal.  Neck: Neck supple.  Cardiovascular: Normal rate, regular rhythm, normal heart sounds and intact  distal pulses.   No murmur heard. Pulmonary/Chest: Effort normal. He has wheezes. He has no rhonchi. He has no rales.  Few coarse breath sounds diffuse with faint expiratory wheeze.  Abdominal: Soft. There is no tenderness.  Lymphadenopathy:    He has no cervical adenopathy.  Neurological: He is alert and oriented to person, place, and time.  Skin: Skin is warm and dry. No rash noted.  Psychiatric: He has a normal mood and affect. His behavior is normal.  Vitals reviewed.  Vitals:   11/06/16 1512 11/06/16  1550  BP: 134/83   Pulse: 77   Resp: 16   Temp: 98.6 F (37 C)   TempSrc: Oral   SpO2: 100%   Weight: 263 lb (119.3 kg)   Height: 6' 1.5" (1.867 m)   PF:  355 L/min   Dg Chest 2 View  Result Date: 11/06/2016 CLINICAL DATA:  Cough, productive for 1 month EXAM: CHEST  2 VIEW COMPARISON:  09/28/2010 CT chest FINDINGS: The heart size and mediastinal contours are within normal limits. Both lungs are clear. The visualized skeletal structures are unremarkable. IMPRESSION: No active cardiopulmonary disease. Electronically Signed   By: Kathreen Devoid   On: 11/06/2016 15:55      Assessment & Plan:    Dakota Johnston is a 41 y.o. male Cough - Plan: azithromycin (ZITHROMAX) 250 MG tablet, DG Chest 2 View  Mild intermittent asthma, unspecified whether complicated - Plan: beclomethasone (QVAR) 80 MCG/ACT inhaler, albuterol (PROVENTIL HFA;VENTOLIN HFA) 108 (90 Base) MCG/ACT inhaler  Allergic rhinitis, unspecified seasonality, unspecified trigger  LRTI (lower respiratory tract infection)  Suspected combination of allergic rhinitis with flare of asthma along with bronchitis with persistent discolored mucus production. Now persistent symptoms over one month. X-ray reassuring.  -Stop Afrin nasal spray to lessen chance of rhinitis medicamentosa.  -Start Flonase daily, Zyrtec daily, allergen avoidance measures as much as possible with avoidance of dust and dust mask  -Start Qvar 80 g inhalation twice a day, albuterol when necessary wheezing, RTC precautions if frequent frequent or persistent use.  -Azithromycin, Z-Pak for current bronchitis, RTC precautions if cough persists.  -Recheck 3 months, sooner if needed.   Meds ordered this encounter  Medications  . beclomethasone (QVAR) 80 MCG/ACT inhaler    Sig: Inhale 1 puff into the lungs 2 (two) times daily.    Dispense:  1 Inhaler    Refill:  3  . albuterol (PROVENTIL HFA;VENTOLIN HFA) 108 (90 Base) MCG/ACT inhaler    Sig: Inhale 1-2 puffs into  the lungs every 4 (four) hours as needed for wheezing or shortness of breath.    Dispense:  1 Inhaler    Refill:  0  . azithromycin (ZITHROMAX) 250 MG tablet    Sig: Take 2 pills by mouth on day 1, then 1 pill by mouth per day on days 2 through 5.    Dispense:  6 tablet    Refill:  0   Patient Instructions    Start azithromycin for persistent cough. Start Qvar 1 puff twice per day for asthma, then albuterol only as needed for breakthrough wheezing. If you require albuterol more than 2-3 times per day, or persistently require albuterol daily in the next 1 week, return to discuss other treatment and possible prednisone.  For nasal congestion, stop Afrin nasal spray as it can cause rebound or chronic congestion. Flonase nasal spray 1-2 sprays per nostril once per day, and Zyrtec over-the-counter once per day. Try to use dust mask or avoid  dust exposure as much as possible.  Return to the clinic or go to the nearest emergency room if any of your symptoms worsen or new symptoms occur.  Allergic Rhinitis Allergic rhinitis is when the mucous membranes in the nose respond to allergens. Allergens are particles in the air that cause your body to have an allergic reaction. This causes you to release allergic antibodies. Through a chain of events, these eventually cause you to release histamine into the blood stream. Although meant to protect the body, it is this release of histamine that causes your discomfort, such as frequent sneezing, congestion, and an itchy, runny nose. What are the causes? Seasonal allergic rhinitis (hay fever) is caused by pollen allergens that may come from grasses, trees, and weeds. Year-round allergic rhinitis (perennial allergic rhinitis) is caused by allergens such as house dust mites, pet dander, and mold spores. What are the signs or symptoms?  Nasal stuffiness (congestion).  Itchy, runny nose with sneezing and tearing of the eyes. How is this diagnosed? Your health  care provider can help you determine the allergen or allergens that trigger your symptoms. If you and your health care provider are unable to determine the allergen, skin or blood testing may be used. Your health care provider will diagnose your condition after taking your health history and performing a physical exam. Your health care provider may assess you for other related conditions, such as asthma, pink eye, or an ear infection. How is this treated? Allergic rhinitis does not have a cure, but it can be controlled by:  Medicines that block allergy symptoms. These may include allergy shots, nasal sprays, and oral antihistamines.  Avoiding the allergen.  Hay fever may often be treated with antihistamines in pill or nasal spray forms. Antihistamines block the effects of histamine. There are over-the-counter medicines that may help with nasal congestion and swelling around the eyes. Check with your health care provider before taking or giving this medicine. If avoiding the allergen or the medicine prescribed do not work, there are many new medicines your health care provider can prescribe. Stronger medicine may be used if initial measures are ineffective. Desensitizing injections can be used if medicine and avoidance does not work. Desensitization is when a patient is given ongoing shots until the body becomes less sensitive to the allergen. Make sure you follow up with your health care provider if problems continue. Follow these instructions at home: It is not possible to completely avoid allergens, but you can reduce your symptoms by taking steps to limit your exposure to them. It helps to know exactly what you are allergic to so that you can avoid your specific triggers. Contact a health care provider if:  You have a fever.  You develop a cough that does not stop easily (persistent).  You have shortness of breath.  You start wheezing.  Symptoms interfere with normal daily activities. This  information is not intended to replace advice given to you by your health care provider. Make sure you discuss any questions you have with your health care provider. Document Released: 12/26/2000 Document Revised: 12/02/2015 Document Reviewed: 12/08/2012 Elsevier Interactive Patient Education  2017 [employer]  Acute Bronchitis, Adult Acute bronchitis is sudden (acute) swelling of the air tubes (bronchi) in the lungs. Acute bronchitis causes these tubes to fill with mucus, which can make it hard to breathe. It can also cause coughing or wheezing. In adults, acute bronchitis usually goes away within 2 weeks. A cough caused by bronchitis may last  up to 3 weeks. Smoking, allergies, and asthma can make the condition worse. Repeated episodes of bronchitis may cause further lung problems, such as chronic obstructive pulmonary disease (COPD). What are the causes? This condition can be caused by germs and by substances that irritate the lungs, including:  Cold and flu viruses. This condition is most often caused by the same virus that causes a cold.  Bacteria.  Exposure to tobacco smoke, dust, fumes, and air pollution.  What increases the risk? This condition is more likely to develop in people who:  Have close contact with someone with acute bronchitis.  Are exposed to lung irritants, such as tobacco smoke, dust, fumes, and vapors.  Have a weak immune system.  Have a respiratory condition such as asthma.  What are the signs or symptoms? Symptoms of this condition include:  A cough.  Coughing up clear, yellow, or green mucus.  Wheezing.  Chest congestion.  Shortness of breath.  A fever.  Body aches.  Chills.  A sore throat.  How is this diagnosed? This condition is usually diagnosed with a physical exam. During the exam, your health care provider may order tests, such as chest X-rays, to rule out other conditions. He or she may also:  Test a sample of your mucus for  bacterial infection.  Check the level of oxygen in your blood. This is done to check for pneumonia.  Do a chest X-ray or lung function testing to rule out pneumonia and other conditions.  Perform blood tests.  Your health care provider will also ask about your symptoms and medical history. How is this treated? Most cases of acute bronchitis clear up over time without treatment. Your health care provider may recommend:  Drinking more fluids. Drinking more makes your mucus thinner, which may make it easier to breathe.  Taking a medicine for a fever or cough.  Taking an antibiotic medicine.  Using an inhaler to help improve shortness of breath and to control a cough.  Using a cool mist vaporizer or humidifier to make it easier to breathe.  Follow these instructions at home: Medicines  Take over-the-counter and prescription medicines only as told by your health care provider.  If you were prescribed an antibiotic, take it as told by your health care provider. Do not stop taking the antibiotic even if you start to feel better. General instructions  Get plenty of rest.  Drink enough fluids to keep your urine clear or pale yellow.  Avoid smoking and secondhand smoke. Exposure to cigarette smoke or irritating chemicals will make bronchitis worse. If you smoke and you need help quitting, ask your health care provider. Quitting smoking will help your lungs heal faster.  Use an inhaler, cool mist vaporizer, or humidifier as told by your health care provider.  Keep all follow-up visits as told by your health care provider. This is important. How is this prevented? To lower your risk of getting this condition again:  Wash your hands often with soap and water. If soap and water are not available, use hand sanitizer.  Avoid contact with people who have cold symptoms.  Try not to touch your hands to your mouth, nose, or eyes.  Make sure to get the flu shot every year.  Contact a  health care provider if:  Your symptoms do not improve in 2 weeks of treatment. Get help right away if:  You cough up blood.  You have chest pain.  You have severe shortness of breath.  You  become dehydrated.  You faint or keep feeling like you are going to faint.  You keep vomiting.  You have a severe headache.  Your fever or chills gets worse. This information is not intended to replace advice given to you by your health care provider. Make sure you discuss any questions you have with your health care provider. Document Released: 05/10/2004 Document Revised: 10/26/2015 Document Reviewed: 09/21/2015 Elsevier Interactive Patient Education  2017 [employer].   Asthma, Adult Asthma is a recurring condition in which the airways tighten and narrow. Asthma can make it difficult to breathe. It can cause coughing, wheezing, and shortness of breath. Asthma episodes, also called asthma attacks, range from minor to life-threatening. Asthma cannot be cured, but medicines and lifestyle changes can help control it. What are the causes? Asthma is believed to be caused by inherited (genetic) and environmental factors, but its exact cause is unknown. Asthma may be triggered by allergens, lung infections, or irritants in the air. Asthma triggers are different for each person. Common triggers include:  Animal dander.  Dust mites.  Cockroaches.  Pollen from trees or grass.  Mold.  Smoke.  Air pollutants such as dust, household cleaners, hair sprays, aerosol sprays, paint fumes, strong chemicals, or strong odors.  Cold air, weather changes, and winds (which increase molds and pollens in the air).  Strong emotional expressions such as crying or laughing hard.  Stress.  Certain medicines (such as aspirin) or types of drugs (such as beta-blockers).  Sulfites in foods and drinks. Foods and drinks that may contain sulfites include dried fruit, potato chips, and sparkling grape  juice.  Infections or inflammatory conditions such as the flu, a cold, or an inflammation of the nasal membranes (rhinitis).  Gastroesophageal reflux disease (GERD).  Exercise or strenuous activity.  What are the signs or symptoms? Symptoms may occur immediately after asthma is triggered or many hours later. Symptoms include:  Wheezing.  Excessive nighttime or early morning coughing.  Frequent or severe coughing with a common cold.  Chest tightness.  Shortness of breath.  How is this diagnosed? The diagnosis of asthma is made by a review of your medical history and a physical exam. Tests may also be performed. These may include:  Lung function studies. These tests show how much air you breathe in and out.  Allergy tests.  Imaging tests such as X-rays.  How is this treated? Asthma cannot be cured, but it can usually be controlled. Treatment involves identifying and avoiding your asthma triggers. It also involves medicines. There are 2 classes of medicine used for asthma treatment:  Controller medicines. These prevent asthma symptoms from occurring. They are usually taken every day.  Reliever or rescue medicines. These quickly relieve asthma symptoms. They are used as needed and provide short-term relief.  Your health care provider will help you create an asthma action plan. An asthma action plan is a written plan for managing and treating your asthma attacks. It includes a list of your asthma triggers and how they may be avoided. It also includes information on when medicines should be taken and when their dosage should be changed. An action plan may also involve the use of a device called a peak flow meter. A peak flow meter measures how well the lungs are working. It helps you monitor your condition. Follow these instructions at home:  Take medicines only as directed by your health care provider. Speak with your health care provider if you have questions about how or when  to  take the medicines.  Use a peak flow meter as directed by your health care provider. Record and keep track of readings.  Understand and use the action plan to help minimize or stop an asthma attack without needing to seek medical care.  Control your home environment in the following ways to help prevent asthma attacks: ? Do not smoke. Avoid being exposed to secondhand smoke. ? Change your heating and air conditioning filter regularly. ? Limit your use of fireplaces and wood stoves. ? Get rid of pests (such as roaches and mice) and their droppings. ? Throw away plants if you see mold on them. ? Clean your floors and dust regularly. Use unscented cleaning products. ? Try to have someone else vacuum for you regularly. Stay out of rooms while they are being vacuumed and for a short while afterward. If you vacuum, use a dust mask from a hardware store, a double-layered or microfilter vacuum cleaner bag, or a vacuum cleaner with a HEPA filter. ? Replace carpet with wood, tile, or vinyl flooring. Carpet can trap dander and dust. ? Use allergy-proof pillows, mattress covers, and box spring covers. ? Wash bed sheets and blankets every week in hot water and dry them in a dryer. ? Use blankets that are made of polyester or cotton. ? Clean bathrooms and kitchens with bleach. If possible, have someone repaint the walls in these rooms with mold-resistant paint. Keep out of the rooms that are being cleaned and painted. ? Wash hands frequently. Contact a health care provider if:  You have wheezing, shortness of breath, or a cough even if taking medicine to prevent attacks.  The colored mucus you cough up (sputum) is thicker than usual.  Your sputum changes from clear or white to yellow, green, gray, or bloody.  You have any problems that may be related to the medicines you are taking (such as a rash, itching, swelling, or trouble breathing).  You are using a reliever medicine more than 2-3 times per  week.  Your peak flow is still at 50-79% of your personal best after following your action plan for 1 hour.  You have a fever. Get help right away if:  You seem to be getting worse and are unresponsive to treatment during an asthma attack.  You are short of breath even at rest.  You get short of breath when doing very little physical activity.  You have difficulty eating, drinking, or talking due to asthma symptoms.  You develop chest pain.  You develop a fast heartbeat.  You have a bluish color to your lips or fingernails.  You are light-headed, dizzy, or faint.  Your peak flow is less than 50% of your personal best. This information is not intended to replace advice given to you by your health care provider. Make sure you discuss any questions you have with your health care provider. Document Released: 04/02/2005 Document Revised: 09/14/2015 Document Reviewed: 10/30/2012 Elsevier Interactive Patient Education  2017 [employer].   IF you received an x-ray today, you will receive an invoice from [facility]. Please contact [facility] at [phone] with questions or concerns regarding your invoice.   IF you received labwork today, you will receive an invoice from [facility]. Please contact [facility] at [phone] with questions or concerns regarding your invoice.   Our billing staff will not be able to assist you with questions regarding bills from these companies.  You will be contacted with the lab results as soon as they are  available. The fastest way to get your results is to activate your My Chart account. Instructions are located on the last page of this paperwork. If you have not heard from [place] regarding the results in 2 weeks, please contact this office.       I personally performed the services described in this documentation, which was scribed in my presence. The recorded information has been reviewed and considered for accuracy and completeness,  addended by me as needed, and agree with information above.  Signed,   Merri Ray, MD [facility] [place].  11/06/16 5:06 PM

## 2016-11-06 NOTE — Patient Instructions (Addendum)
Start azithromycin for persistent cough. Start Qvar 1 puff twice per day for asthma, then albuterol only as needed for breakthrough wheezing. If you require albuterol more than 2-3 times per day, or persistently require albuterol daily in the next 1 week, return to discuss other treatment and possible prednisone.  For nasal congestion, stop Afrin nasal spray as it can cause rebound or chronic congestion. Flonase nasal spray 1-2 sprays per nostril once per day, and Zyrtec over-the-counter once per day. Try to use dust mask or avoid dust exposure as much as possible.  Return to the clinic or go to the nearest emergency room if any of your symptoms worsen or new symptoms occur.  Allergic Rhinitis Allergic rhinitis is when the mucous membranes in the nose respond to allergens. Allergens are particles in the air that cause your body to have an allergic reaction. This causes you to release allergic antibodies. Through a chain of events, these eventually cause you to release histamine into the blood stream. Although meant to protect the body, it is this release of histamine that causes your discomfort, such as frequent sneezing, congestion, and an itchy, runny nose. What are the causes? Seasonal allergic rhinitis (hay fever) is caused by pollen allergens that may come from grasses, trees, and weeds. Year-round allergic rhinitis (perennial allergic rhinitis) is caused by allergens such as house dust mites, pet dander, and mold spores. What are the signs or symptoms?  Nasal stuffiness (congestion).  Itchy, runny nose with sneezing and tearing of the eyes. How is this diagnosed? Your health care provider can help you determine the allergen or allergens that trigger your symptoms. If you and your health care provider are unable to determine the allergen, skin or blood testing may be used. Your health care provider will diagnose your condition after taking your health history and performing a physical exam.  Your health care provider may assess you for other related conditions, such as asthma, pink eye, or an ear infection. How is this treated? Allergic rhinitis does not have a cure, but it can be controlled by:  Medicines that block allergy symptoms. These may include allergy shots, nasal sprays, and oral antihistamines.  Avoiding the allergen.  Hay fever may often be treated with antihistamines in pill or nasal spray forms. Antihistamines block the effects of histamine. There are over-the-counter medicines that may help with nasal congestion and swelling around the eyes. Check with your health care provider before taking or giving this medicine. If avoiding the allergen or the medicine prescribed do not work, there are many new medicines your health care provider can prescribe. Stronger medicine may be used if initial measures are ineffective. Desensitizing injections can be used if medicine and avoidance does not work. Desensitization is when a patient is given ongoing shots until the body becomes less sensitive to the allergen. Make sure you follow up with your health care provider if problems continue. Follow these instructions at home: It is not possible to completely avoid allergens, but you can reduce your symptoms by taking steps to limit your exposure to them. It helps to know exactly what you are allergic to so that you can avoid your specific triggers. Contact a health care provider if:  You have a fever.  You develop a cough that does not stop easily (persistent).  You have shortness of breath.  You start wheezing.  Symptoms interfere with normal daily activities. This information is not intended to replace advice given to you by your health care provider.  Make sure you discuss any questions you have with your health care provider. Document Released: 12/26/2000 Document Revised: 12/02/2015 Document Reviewed: 12/08/2012 Elsevier Interactive Patient Education  2017 Elsevier  Inc[employer]  Acute Bronchitis, Adult Acute bronchitis is sudden (acute) swelling of the air tubes (bronchi) in the lungs. Acute bronchitis causes these tubes to fill with mucus, which can make it hard to breathe. It can also cause coughing or wheezing. In adults, acute bronchitis usually goes away within 2 weeks. A cough caused by bronchitis may last up to 3 weeks. Smoking, allergies, and asthma can make the condition worse. Repeated episodes of bronchitis may cause further lung problems, such as chronic obstructive pulmonary disease (COPD). What are the causes? This condition can be caused by germs and by substances that irritate the lungs, including:  Cold and flu viruses. This condition is most often caused by the same virus that causes a cold.  Bacteria.  Exposure to tobacco smoke, dust, fumes, and air pollution.  What increases the risk? This condition is more likely to develop in people who:  Have close contact with someone with acute bronchitis.  Are exposed to lung irritants, such as tobacco smoke, dust, fumes, and vapors.  Have a weak immune system.  Have a respiratory condition such as asthma.  What are the signs or symptoms? Symptoms of this condition include:  A cough.  Coughing up clear, yellow, or green mucus.  Wheezing.  Chest congestion.  Shortness of breath.  A fever.  Body aches.  Chills.  A sore throat.  How is this diagnosed? This condition is usually diagnosed with a physical exam. During the exam, your health care provider may order tests, such as chest X-rays, to rule out other conditions. He or she may also:  Test a sample of your mucus for bacterial infection.  Check the level of oxygen in your blood. This is done to check for pneumonia.  Do a chest X-ray or lung function testing to rule out pneumonia and other conditions.  Perform blood tests.  Your health care provider will also ask about your symptoms and medical history. How is this  treated? Most cases of acute bronchitis clear up over time without treatment. Your health care provider may recommend:  Drinking more fluids. Drinking more makes your mucus thinner, which may make it easier to breathe.  Taking a medicine for a fever or cough.  Taking an antibiotic medicine.  Using an inhaler to help improve shortness of breath and to control a cough.  Using a cool mist vaporizer or humidifier to make it easier to breathe.  Follow these instructions at home: Medicines  Take over-the-counter and prescription medicines only as told by your health care provider.  If you were prescribed an antibiotic, take it as told by your health care provider. Do not stop taking the antibiotic even if you start to feel better. General instructions  Get plenty of rest.  Drink enough fluids to keep your urine clear or pale yellow.  Avoid smoking and secondhand smoke. Exposure to cigarette smoke or irritating chemicals will make bronchitis worse. If you smoke and you need help quitting, ask your health care provider. Quitting smoking will help your lungs heal faster.  Use an inhaler, cool mist vaporizer, or humidifier as told by your health care provider.  Keep all follow-up visits as told by your health care provider. This is important. How is this prevented? To lower your risk of getting this condition again:  Wash your hands often  with soap and water. If soap and water are not available, use hand sanitizer.  Avoid contact with people who have cold symptoms.  Try not to touch your hands to your mouth, nose, or eyes.  Make sure to get the flu shot every year.  Contact a health care provider if:  Your symptoms do not improve in 2 weeks of treatment. Get help right away if:  You cough up blood.  You have chest pain.  You have severe shortness of breath.  You become dehydrated.  You faint or keep feeling like you are going to faint.  You keep vomiting.  You have a  severe headache.  Your fever or chills gets worse. This information is not intended to replace advice given to you by your health care provider. Make sure you discuss any questions you have with your health care provider. Document Released: 05/10/2004 Document Revised: 10/26/2015 Document Reviewed: 09/21/2015 Elsevier Interactive Patient Education  2017 [place].   Asthma, Adult Asthma is a recurring condition in which the airways tighten and narrow. Asthma can make it difficult to breathe. It can cause coughing, wheezing, and shortness of breath. Asthma episodes, also called asthma attacks, range from minor to life-threatening. Asthma cannot be cured, but medicines and lifestyle changes can help control it. What are the causes? Asthma is believed to be caused by inherited (genetic) and environmental factors, but its exact cause is unknown. Asthma may be triggered by allergens, lung infections, or irritants in the air. Asthma triggers are different for each person. Common triggers include:  Animal dander.  Dust mites.  Cockroaches.  Pollen from trees or grass.  Mold.  Smoke.  Air pollutants such as dust, household cleaners, hair sprays, aerosol sprays, paint fumes, strong chemicals, or strong odors.  Cold air, weather changes, and winds (which increase molds and pollens in the air).  Strong emotional expressions such as crying or laughing hard.  Stress.  Certain medicines (such as aspirin) or types of drugs (such as beta-blockers).  Sulfites in foods and drinks. Foods and drinks that may contain sulfites include dried fruit, potato chips, and sparkling grape juice.  Infections or inflammatory conditions such as the flu, a cold, or an inflammation of the nasal membranes (rhinitis).  Gastroesophageal reflux disease (GERD).  Exercise or strenuous activity.  What are the signs or symptoms? Symptoms may occur immediately after asthma is triggered or many hours later.  Symptoms include:  Wheezing.  Excessive nighttime or early morning coughing.  Frequent or severe coughing with a common cold.  Chest tightness.  Shortness of breath.  How is this diagnosed? The diagnosis of asthma is made by a review of your medical history and a physical exam. Tests may also be performed. These may include:  Lung function studies. These tests show how much air you breathe in and out.  Allergy tests.  Imaging tests such as X-rays.  How is this treated? Asthma cannot be cured, but it can usually be controlled. Treatment involves identifying and avoiding your asthma triggers. It also involves medicines. There are 2 classes of medicine used for asthma treatment:  Controller medicines. These prevent asthma symptoms from occurring. They are usually taken every day.  Reliever or rescue medicines. These quickly relieve asthma symptoms. They are used as needed and provide short-term relief.  Your health care provider will help you create an asthma action plan. An asthma action plan is a written plan for managing and treating your asthma attacks. It includes a list of  your asthma triggers and how they may be avoided. It also includes information on when medicines should be taken and when their dosage should be changed. An action plan may also involve the use of a device called a peak flow meter. A peak flow meter measures how well the lungs are working. It helps you monitor your condition. Follow these instructions at home:  Take medicines only as directed by your health care provider. Speak with your health care provider if you have questions about how or when to take the medicines.  Use a peak flow meter as directed by your health care provider. Record and keep track of readings.  Understand and use the action plan to help minimize or stop an asthma attack without needing to seek medical care.  Control your home environment in the following ways to help prevent asthma  attacks: ? Do not smoke. Avoid being exposed to secondhand smoke. ? Change your heating and air conditioning filter regularly. ? Limit your use of fireplaces and wood stoves. ? Get rid of pests (such as roaches and mice) and their droppings. ? Throw away plants if you see mold on them. ? Clean your floors and dust regularly. Use unscented cleaning products. ? Try to have someone else vacuum for you regularly. Stay out of rooms while they are being vacuumed and for a short while afterward. If you vacuum, use a dust mask from a hardware store, a double-layered or microfilter vacuum cleaner bag, or a vacuum cleaner with a HEPA filter. ? Replace carpet with wood, tile, or vinyl flooring. Carpet can trap dander and dust. ? Use allergy-proof pillows, mattress covers, and box spring covers. ? Wash bed sheets and blankets every week in hot water and dry them in a dryer. ? Use blankets that are made of polyester or cotton. ? Clean bathrooms and kitchens with bleach. If possible, have someone repaint the walls in these rooms with mold-resistant paint. Keep out of the rooms that are being cleaned and painted. ? Wash hands frequently. Contact a health care provider if:  You have wheezing, shortness of breath, or a cough even if taking medicine to prevent attacks.  The colored mucus you cough up (sputum) is thicker than usual.  Your sputum changes from clear or white to yellow, green, gray, or bloody.  You have any problems that may be related to the medicines you are taking (such as a rash, itching, swelling, or trouble breathing).  You are using a reliever medicine more than 2-3 times per week.  Your peak flow is still at 50-79% of your personal best after following your action plan for 1 hour.  You have a fever. Get help right away if:  You seem to be getting worse and are unresponsive to treatment during an asthma attack.  You are short of breath even at rest.  You get short of breath when  doing very little physical activity.  You have difficulty eating, drinking, or talking due to asthma symptoms.  You develop chest pain.  You develop a fast heartbeat.  You have a bluish color to your lips or fingernails.  You are light-headed, dizzy, or faint.  Your peak flow is less than 50% of your personal best. This information is not intended to replace advice given to you by your health care provider. Make sure you discuss any questions you have with your health care provider. Document Released: 04/02/2005 Document Revised: 09/14/2015 Document Reviewed: 10/30/2012 Elsevier Interactive Patient Education  2017 Elsevier  Inc.   IF you received an x-ray today, you will receive an invoice from [facility]. Please contact [facility] at [phone] with questions or concerns regarding your invoice.   IF you received labwork today, you will receive an invoice from [facility]. Please contact [facility] at [phone] with questions or concerns regarding your invoice.   Our billing staff will not be able to assist you with questions regarding bills from these companies.  You will be contacted with the lab results as soon as they are available. The fastest way to get your results is to activate your My Chart account. Instructions are located on the last page of this paperwork. If you have not heard from [place] regarding the results in 2 weeks, please contact this office.

## 2016-11-08 ENCOUNTER — Telehealth: Payer: Self-pay | Admitting: Family Medicine

## 2016-11-08 NOTE — Telephone Encounter (Signed)
Pt is calling stating that his insurance will not cover his Qvar inhaler.  The pharmacy should have sent back another request for this prescription.  Please advise  [phone]

## 2016-11-09 ENCOUNTER — Other Ambulatory Visit: Payer: Self-pay | Admitting: Family Medicine

## 2016-11-10 ENCOUNTER — Other Ambulatory Visit: Payer: Self-pay | Admitting: Family Medicine

## 2016-11-10 MED ORDER — FLUTICASONE PROPIONATE HFA 44 MCG/ACT IN AERO: 2.0000 | INHALATION_SPRAY | Freq: Two times a day (BID) | RESPIRATORY_TRACT | 5 refills | Status: DC

## 2016-11-10 NOTE — Progress Notes (Signed)
Due to insurance coverage request, Flovent prescribed in place of Qvar.

## 2016-11-12 NOTE — Telephone Encounter (Signed)
Already changed to [facility]

## 2017-05-30 DIAGNOSIS — R2232 Localized swelling, mass and lump, left upper limb: Secondary | ICD-10-CM | POA: Diagnosis not present

## 2017-05-30 DIAGNOSIS — S56911A Strain of unspecified muscles, fascia and tendons at forearm level, right arm, initial encounter: Secondary | ICD-10-CM | POA: Diagnosis not present

## 2017-06-28 ENCOUNTER — Other Ambulatory Visit: Payer: Self-pay | Admitting: Orthopedic Surgery

## 2017-07-15 ENCOUNTER — Encounter (HOSPITAL_BASED_OUTPATIENT_CLINIC_OR_DEPARTMENT_OTHER): Payer: Self-pay | Admitting: *Deleted

## 2017-07-15 ENCOUNTER — Other Ambulatory Visit: Payer: Self-pay

## 2017-07-18 NOTE — H&P (Signed)
Dakota Johnston is an 42 y.o. male.   CC / Reason for Visit: Left thumb mass and right forearm pain HPI: This patient presents today, wishing to proceed with surgical treatment for his left thumb mass, but also noting that a couple weeks ago he began to have pain in the right forearm, thinking that it was a strain or perhaps the result of repetitive strain.  He is employed both at [employer], but also at [employer] in [employer] affairs [employer].  He has been taking some very intermittent ibuprofen.  HPI 12-15-14.  This patient returns for reevaluation of his left thumb.  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  He is here to discuss surgical options.    HPI 11/24/2014:This patient is a 42 year old, right-hand-dominant, male who presents today with a large mass on the dorsal aspect of his left thumb that has been present for approximately 5 years.  He reports that it started out quite small but has grown substantially.  He states that it is not really painful unless he bumps it and that it does not keep him from doing daily activities.  He is sent here today for further treatment options and evaluation.  Past Medical History:  Diagnosis Date  . Allergy   . Asthma     Past Surgical History:  Procedure Laterality Date  . TUMOR REMOVAL     leg  . TUMOR REMOVAL    . WISDOM TOOTH EXTRACTION  2017    Family History  Problem Relation Age of Onset  . Diabetes Father   . Diabetes Paternal Grandmother    Social History:  reports that he has been smoking cigars.  He has a 2.00 pack-year smoking history. He has never used smokeless tobacco. He reports that he drinks alcohol. He reports that he does not use drugs.  Allergies: No Known Allergies  No medications prior to admission.    No results found for this or any previous visit (from the past 48 hour(s)). No results found.  Review of Systems  All other systems reviewed and are  negative.   Height 6\' 3"  (1.905 m), weight 120.2 kg (265 lb). Physical Exam  Constitutional:  WD, WN, NAD HEENT:  NCAT, EOMI Neuro/Psych:  Alert & oriented to person, place, and time; appropriate mood & affect Lymphatic: No generalized UE edema or lymphadenopathy Extremities / MSK:  Both UE are normal with respect to appearance, ranges of motion, joint stability, muscle strength/tone, sensation, & perfusion except as otherwise noted:  There is still a large mass over the dorsal aspect of his left thumb.  It appears very slightly larger than when last evaluated.  Gross digital range of motion remains within normal limits and the patient is NVI.    On the right forearm, there is maximum tenderness to palpation on the volar radial forearm musculature, at the junction of the proximal and middle one thirds.  He has pain with resisted wrist flexion and also elbow flexion, more so than digital flexion or wrist extension.  The distal biceps tendon is mildly tender to palpation versus the musculature of the extensor wad.  It may actually be most tender at the brachioradialis.  Labs / X-rays: No new x-rays.  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  Please see MRI report for further information.  Assessment:  1.  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  Subacute right forearm strain  Plan:  The findings were discussed with the patient and his wife.  With regard to the forearm, I suspect that taking the meloxicam regularly with some activity modification will be sufficient for it to resolve.  In addition, if his surgery is performed for his left hand very soon, it will provide some relative rest during his time of recovery away from work as well.  With regard to his left thumb mass, surgical excision is recommended  at this time and the patient is in agreement. The details of the operative procedure were discussed with the patient.  Questions were invited and answered.  In addition to the goal of the procedure, the risks of the procedure to include but not limited to bleeding; infection; damage to the nerves or blood vessels that could result in bleeding, numbness, weakness, chronic pain, and the need for additional procedures; stiffness; the need for revision surgery; and anesthetic risks were reviewed.  No specific outcome was guaranteed or implied.  Informed consent was obtained.   Jolyn Nap, MD 07/18/2017, 8:57 AM

## 2017-07-22 ENCOUNTER — Ambulatory Visit (HOSPITAL_BASED_OUTPATIENT_CLINIC_OR_DEPARTMENT_OTHER)
Admission: RE | Admit: 2017-07-22 | Discharge: 2017-07-22 | Disposition: A | Discharge status: Home / Self Care | Payer: 59 | Source: Ambulatory Visit | Attending: Orthopedic Surgery | Admitting: Orthopedic Surgery

## 2017-07-22 ENCOUNTER — Encounter (HOSPITAL_BASED_OUTPATIENT_CLINIC_OR_DEPARTMENT_OTHER)
Admission: RE | Disposition: A | Discharge status: Home / Self Care | Payer: Self-pay | Source: Ambulatory Visit | Attending: Orthopedic Surgery

## 2017-07-22 ENCOUNTER — Encounter (HOSPITAL_BASED_OUTPATIENT_CLINIC_OR_DEPARTMENT_OTHER): Payer: Self-pay | Admitting: Certified Registered"

## 2017-07-22 ENCOUNTER — Ambulatory Visit (HOSPITAL_BASED_OUTPATIENT_CLINIC_OR_DEPARTMENT_OTHER): Payer: 59 | Admitting: Anesthesiology

## 2017-07-22 ENCOUNTER — Other Ambulatory Visit: Payer: Self-pay

## 2017-07-22 DIAGNOSIS — Z791 Long term (current) use of non-steroidal anti-inflammatories (NSAID): Secondary | ICD-10-CM | POA: Diagnosis not present

## 2017-07-22 DIAGNOSIS — Z6833 Body mass index (BMI) 33.0-33.9, adult: Secondary | ICD-10-CM | POA: Diagnosis not present

## 2017-07-22 DIAGNOSIS — Z79899 Other long term (current) drug therapy: Secondary | ICD-10-CM | POA: Diagnosis not present

## 2017-07-22 DIAGNOSIS — M65842 Other synovitis and tenosynovitis, left hand: Secondary | ICD-10-CM | POA: Diagnosis not present

## 2017-07-22 DIAGNOSIS — D481 Neoplasm of uncertain behavior of connective and other soft tissue: Secondary | ICD-10-CM | POA: Diagnosis not present

## 2017-07-22 DIAGNOSIS — R2232 Localized swelling, mass and lump, left upper limb: Secondary | ICD-10-CM | POA: Diagnosis not present

## 2017-07-22 DIAGNOSIS — J309 Allergic rhinitis, unspecified: Secondary | ICD-10-CM | POA: Diagnosis not present

## 2017-07-22 DIAGNOSIS — J45909 Unspecified asthma, uncomplicated: Secondary | ICD-10-CM | POA: Diagnosis not present

## 2017-07-22 DIAGNOSIS — F17290 Nicotine dependence, other tobacco product, uncomplicated: Secondary | ICD-10-CM | POA: Diagnosis not present

## 2017-07-22 DIAGNOSIS — E669 Obesity, unspecified: Secondary | ICD-10-CM | POA: Diagnosis not present

## 2017-07-22 HISTORY — PX: EXCISION METACARPAL MASS: SHX6372

## 2017-07-22 SURGERY — EXCISION METACARPAL MASS
Anesthesia: General | Laterality: Left

## 2017-07-22 MED ORDER — ACETAMINOPHEN 325 MG PO TABS: 650.0000 mg | ORAL_TABLET | Freq: Four times a day (QID) | ORAL | Status: AC

## 2017-07-22 MED ORDER — PROMETHAZINE HCL 25 MG/ML IJ SOLN: 6.2500 mg | INTRAMUSCULAR | Status: DC | PRN

## 2017-07-22 MED ORDER — PROPOFOL 500 MG/50ML IV EMUL
INTRAVENOUS | Status: AC
  Filled 2017-07-22: qty 50

## 2017-07-22 MED ORDER — DEXAMETHASONE SODIUM PHOSPHATE 10 MG/ML IJ SOLN
INTRAMUSCULAR | Status: AC
  Filled 2017-07-22: qty 3

## 2017-07-22 MED ORDER — OXYCODONE HCL 5 MG PO TABS
ORAL_TABLET | ORAL | Status: AC
  Filled 2017-07-22: qty 1

## 2017-07-22 MED ORDER — CEFAZOLIN SODIUM-DEXTROSE 2-4 GM/100ML-% IV SOLN: 2.0000 g | INTRAVENOUS | Status: DC

## 2017-07-22 MED ORDER — SCOPOLAMINE 1 MG/3DAYS TD PT72: 1.0000 | MEDICATED_PATCH | Freq: Once | TRANSDERMAL | Status: DC | PRN

## 2017-07-22 MED ORDER — PHENYLEPHRINE HCL 10 MG/ML IJ SOLN
INTRAMUSCULAR | Status: AC
  Filled 2017-07-22: qty 2

## 2017-07-22 MED ORDER — LACTATED RINGERS IV SOLN: INTRAVENOUS | Status: DC

## 2017-07-22 MED ORDER — DEXAMETHASONE SODIUM PHOSPHATE 10 MG/ML IJ SOLN
INTRAMUSCULAR | Status: DC | PRN
  Administered 2017-07-22: 10 mg via INTRAVENOUS

## 2017-07-22 MED ORDER — FENTANYL CITRATE (PF) 100 MCG/2ML IJ SOLN: 25.0000 ug | INTRAMUSCULAR | Status: DC | PRN

## 2017-07-22 MED ORDER — OXYCODONE HCL 5 MG PO TABS: 5.0000 mg | ORAL_TABLET | Freq: Four times a day (QID) | ORAL | 0 refills | Status: AC | PRN

## 2017-07-22 MED ORDER — ONDANSETRON HCL 4 MG/2ML IJ SOLN
INTRAMUSCULAR | Status: AC
  Filled 2017-07-22: qty 12

## 2017-07-22 MED ORDER — ACETAMINOPHEN 500 MG PO TABS
1000.0000 mg | ORAL_TABLET | Freq: Once | ORAL | Status: AC
  Administered 2017-07-22: 1000 mg via ORAL

## 2017-07-22 MED ORDER — EPHEDRINE 5 MG/ML INJ
INTRAVENOUS | Status: AC
  Filled 2017-07-22: qty 20

## 2017-07-22 MED ORDER — LIDOCAINE HCL (CARDIAC) 20 MG/ML IV SOLN
INTRAVENOUS | Status: AC
  Filled 2017-07-22: qty 20

## 2017-07-22 MED ORDER — DEXTROSE 5 % IV SOLN: 3.0000 g | Freq: Once | INTRAVENOUS | Status: DC

## 2017-07-22 MED ORDER — LIDOCAINE HCL (CARDIAC) 20 MG/ML IV SOLN
INTRAVENOUS | Status: DC | PRN
  Administered 2017-07-22: 60 mg via INTRAVENOUS

## 2017-07-22 MED ORDER — LACTATED RINGERS IV SOLN
INTRAVENOUS | Status: DC
  Administered 2017-07-22: 11:00:00 via INTRAVENOUS

## 2017-07-22 MED ORDER — MIDAZOLAM HCL 2 MG/2ML IJ SOLN
1.0000 mg | INTRAMUSCULAR | Status: DC | PRN
  Administered 2017-07-22: 2 mg via INTRAVENOUS

## 2017-07-22 MED ORDER — FENTANYL CITRATE (PF) 100 MCG/2ML IJ SOLN
50.0000 ug | INTRAMUSCULAR | Status: DC | PRN
  Administered 2017-07-22: 100 ug via INTRAVENOUS

## 2017-07-22 MED ORDER — MIDAZOLAM HCL 2 MG/2ML IJ SOLN
INTRAMUSCULAR | Status: AC
  Filled 2017-07-22: qty 2

## 2017-07-22 MED ORDER — CEFAZOLIN SODIUM-DEXTROSE 2-4 GM/100ML-% IV SOLN
INTRAVENOUS | Status: AC
  Filled 2017-07-22: qty 200

## 2017-07-22 MED ORDER — BUPIVACAINE-EPINEPHRINE 0.5% -1:200000 IJ SOLN
INTRAMUSCULAR | Status: DC | PRN
  Administered 2017-07-22: 9 mL

## 2017-07-22 MED ORDER — OXYCODONE HCL 5 MG PO TABS
5.0000 mg | ORAL_TABLET | Freq: Once | ORAL | Status: AC | PRN
  Administered 2017-07-22: 5 mg via ORAL

## 2017-07-22 MED ORDER — PHENYLEPHRINE 40 MCG/ML (10ML) SYRINGE FOR IV PUSH (FOR BLOOD PRESSURE SUPPORT)
PREFILLED_SYRINGE | INTRAVENOUS | Status: AC
  Filled 2017-07-22: qty 20

## 2017-07-22 MED ORDER — ACETAMINOPHEN 500 MG PO TABS
ORAL_TABLET | ORAL | Status: AC
  Filled 2017-07-22: qty 2

## 2017-07-22 MED ORDER — FENTANYL CITRATE (PF) 100 MCG/2ML IJ SOLN
INTRAMUSCULAR | Status: AC
  Filled 2017-07-22: qty 2

## 2017-07-22 MED ORDER — PROPOFOL 10 MG/ML IV BOLUS
INTRAVENOUS | Status: DC | PRN
  Administered 2017-07-22: 250 mg via INTRAVENOUS

## 2017-07-22 SURGICAL SUPPLY — 47 items
BANDAGE COBAN STERILE 2 (GAUZE/BANDAGES/DRESSINGS) IMPLANT
BLADE MINI RND TIP GREEN BEAV (BLADE) IMPLANT
BLADE SURG 15 STRL LF DISP TIS (BLADE) ×1 IMPLANT
BLADE SURG 15 STRL SS (BLADE) ×2
BNDG COHESIVE 1X5 TAN STRL LF (GAUZE/BANDAGES/DRESSINGS) ×3 IMPLANT
BNDG COHESIVE 4X5 TAN STRL (GAUZE/BANDAGES/DRESSINGS) ×3 IMPLANT
BNDG CONFORM 2 STRL LF (GAUZE/BANDAGES/DRESSINGS) IMPLANT
BNDG ESMARK 4X9 LF (GAUZE/BANDAGES/DRESSINGS) IMPLANT
BNDG GAUZE 1X2.1 STRL (MISCELLANEOUS) IMPLANT
BNDG GAUZE ELAST 4 BULKY (GAUZE/BANDAGES/DRESSINGS) ×3 IMPLANT
CHLORAPREP W/TINT 26ML (MISCELLANEOUS) ×3 IMPLANT
CORD BIPOLAR FORCEPS 12FT (ELECTRODE) ×3 IMPLANT
COVER BACK TABLE 60X90IN (DRAPES) ×3 IMPLANT
COVER MAYO STAND STRL (DRAPES) ×3 IMPLANT
CUFF TOURNIQUET SINGLE 18IN (TOURNIQUET CUFF) ×3 IMPLANT
DRAIN PENROSE 1/2X12 LTX STRL (WOUND CARE) IMPLANT
DRAPE EXTREMITY T 121X128X90 (DRAPE) ×3 IMPLANT
DRAPE SURG 17X23 STRL (DRAPES) ×3 IMPLANT
DRSG EMULSION OIL 3X3 NADH (GAUZE/BANDAGES/DRESSINGS) ×3 IMPLANT
GAUZE SPONGE 4X4 12PLY STRL (GAUZE/BANDAGES/DRESSINGS) ×3 IMPLANT
GAUZE SPONGE 4X4 12PLY STRL LF (GAUZE/BANDAGES/DRESSINGS) ×3 IMPLANT
GLOVE BIO SURGEON STRL SZ7.5 (GLOVE) ×3 IMPLANT
GLOVE BIOGEL PI IND STRL 7.0 (GLOVE) ×1 IMPLANT
GLOVE BIOGEL PI IND STRL 8 (GLOVE) ×1 IMPLANT
GLOVE BIOGEL PI INDICATOR 7.0 (GLOVE) ×2
GLOVE BIOGEL PI INDICATOR 8 (GLOVE) ×2
GLOVE ECLIPSE 6.5 STRL STRAW (GLOVE) ×3 IMPLANT
GOWN STRL REUS W/ TWL LRG LVL3 (GOWN DISPOSABLE) ×2 IMPLANT
GOWN STRL REUS W/TWL LRG LVL3 (GOWN DISPOSABLE) ×4
GOWN STRL REUS W/TWL XL LVL3 (GOWN DISPOSABLE) ×3 IMPLANT
NDL SAFETY ECLIPSE 18X1.5 (NEEDLE) ×1 IMPLANT
NEEDLE HYPO 18GX1.5 SHARP (NEEDLE) ×2
NEEDLE HYPO 25X1 1.5 SAFETY (NEEDLE) IMPLANT
NS IRRIG 1000ML POUR BTL (IV SOLUTION) ×3 IMPLANT
PACK BASIN DAY SURGERY FS (CUSTOM PROCEDURE TRAY) ×3 IMPLANT
PADDING CAST ABS 4INX4YD NS (CAST SUPPLIES)
PADDING CAST ABS COTTON 4X4 ST (CAST SUPPLIES) IMPLANT
RUBBERBAND STERILE (MISCELLANEOUS) IMPLANT
STOCKINETTE 6  STRL (DRAPES) ×2
STOCKINETTE 6 STRL (DRAPES) ×1 IMPLANT
SUT VICRYL RAPIDE 4-0 (SUTURE) IMPLANT
SUT VICRYL RAPIDE 4/0 PS 2 (SUTURE) ×3 IMPLANT
SYR 10ML LL (SYRINGE) ×3 IMPLANT
SYR BULB 3OZ (MISCELLANEOUS) ×3 IMPLANT
TOWEL OR 17X24 6PK STRL BLUE (TOWEL DISPOSABLE) ×3 IMPLANT
TOWEL OR NON WOVEN STRL DISP B (DISPOSABLE) ×3 IMPLANT
UNDERPAD 30X30 (UNDERPADS AND DIAPERS) ×3 IMPLANT

## 2017-07-22 NOTE — Anesthesia Preprocedure Evaluation (Signed)
Anesthesia Evaluation  Patient identified by MRN, date of birth, ID band Patient awake    Reviewed: Allergy & Precautions, NPO status , Patient's Chart, lab work & pertinent test results  Airway Mallampati: II  TM Distance: >3 FB Neck ROM: Full    Dental  (+) Teeth Intact, Dental Advisory Given   Pulmonary asthma , Current Smoker,    Pulmonary exam normal breath sounds clear to auscultation       Cardiovascular Exercise Tolerance: Good negative cardio ROS Normal cardiovascular exam Rhythm:Regular Rate:Normal     Neuro/Psych negative neurological ROS     GI/Hepatic negative GI ROS, Neg liver ROS,   Endo/Other  Obesity   Renal/GU negative Renal ROS     Musculoskeletal LEFT THUMB MASS   Abdominal   Peds  Hematology negative hematology ROS (+)   Anesthesia Other Findings Day of surgery medications reviewed with the patient.  Reproductive/Obstetrics                             Anesthesia Physical Anesthesia Plan  ASA: II  Anesthesia Plan: General   Post-op Pain Management:    Induction: Intravenous  PONV Risk Score and Plan: 2 and Dexamethasone, Ondansetron and Midazolam  Airway Management Planned: LMA  Additional Equipment:   Intra-op Plan:   Post-operative Plan: Extubation in OR  Informed Consent: I have reviewed the patients History and Physical, chart, labs and discussed the procedure including the risks, benefits and alternatives for the proposed anesthesia with the patient or authorized representative who has indicated his/her understanding and acceptance.   Dental advisory given  Plan Discussed with: CRNA  Anesthesia Plan Comments:         Anesthesia Quick Evaluation

## 2017-07-22 NOTE — Interval H&P Note (Signed)
History and Physical Interval Note:  07/22/2017 11:22 AM  Dakota Johnston  has presented today for surgery, with the diagnosis of LEFT THUMB MASS R22.32  The various methods of treatment have been discussed with the patient and family. After consideration of risks, benefits and other options for treatment, the patient has consented to  Procedure(s): LEFT THUMB MASS EXCISION (Left) as a surgical intervention .  The patient's history has been reviewed, patient examined, no change in status, stable for surgery.  I have reviewed the patient's chart and labs.  Questions were answered to the patient's satisfaction.     Jolyn Nap

## 2017-07-22 NOTE — Discharge Instructions (Signed)
Discharge Instructions   You have a light dressing on your hand.  You may begin gentle motion of your fingers and hand immediately, but you should not do any heavy lifting or gripping.  Elevate your hand to reduce pain & swelling of the digits.  Ice over the operative site may be helpful to reduce pain & swelling.  DO NOT USE HEAT. Pain medicine has been prescribed for you.  Take Meloxicam as prescribed. Take Tylenol 650 mg every 6 hours. Take the prescribed pain medicine additionally for severe breakthrough pain. Leave the dressing in place until the third day after your surgery and then remove it, leaving it open to air.  After the bandage has been removed you may shower, regularly washing the incision and letting the water run over it, but not submerging it (no swimming, soaking it in dishwater, etc.) You may drive a car when you are off of prescription pain medications and can safely control your vehicle with both hands. We will address whether therapy will be required or not when you return to the office. You may have already made your follow-up appointment when we completed your preop visit.  If not, please call our office today or the next business day to make your return appointment for 10-15 days after surgery.   Please call [phone] during normal business hours or [phone] after hours for any problems. Including the following:  - excessive redness of the incisions - drainage for more than 4 days - fever of more than 101.5 F  *Please note that pain medications will not be refilled after hours or on weekends.  WORK STATUS: This patient will be out of work until he returns to clinic for his first post operative exam in 10-15 days from today.   Post Anesthesia Home Care Instructions  Activity: Get plenty of rest for the remainder of the day. A responsible individual must stay with you for 24 hours following the procedure.  For the next 24 hours, DO NOT: -Drive a  car -Paediatric nurse -Drink alcoholic beverages -Take any medication unless instructed by your physician -Make any legal decisions or sign important papers.  Meals: Start with liquid foods such as gelatin or soup. Progress to regular foods as tolerated. Avoid greasy, spicy, heavy foods. If nausea and/or vomiting occur, drink only clear liquids until the nausea and/or vomiting subsides. Call your physician if vomiting continues.  Special Instructions/Symptoms: Your throat may feel dry or sore from the anesthesia or the breathing tube placed in your throat during surgery. If this causes discomfort, gargle with warm salt water. The discomfort should disappear within 24 hours.  If you had a scopolamine patch placed behind your ear for the management of post- operative nausea and/or vomiting:  1. The medication in the patch is effective for 72 hours, after which it should be removed.  Wrap patch in a tissue and discard in the trash. Wash hands thoroughly with soap and water. 2. You may remove the patch earlier than 72 hours if you experience unpleasant side effects which may include dry mouth, dizziness or visual disturbances. 3. Avoid touching the patch. Wash your hands with soap and water after contact with the patch.

## 2017-07-22 NOTE — Anesthesia Postprocedure Evaluation (Signed)
Anesthesia Post Note  Patient: JAKEOB TULLIS  Procedure(s) Performed: LEFT THUMB MASS EXCISION (Left )     Patient location during evaluation: PACU Anesthesia Type: General Level of consciousness: awake and alert Pain management: pain level controlled Vital Signs Assessment: post-procedure vital signs reviewed and stable Respiratory status: spontaneous breathing, nonlabored ventilation and respiratory function stable Cardiovascular status: blood pressure returned to baseline and stable Postop Assessment: no apparent nausea or vomiting Anesthetic complications: no    Last Vitals:  Vitals:   07/22/17 1438 07/22/17 1509  BP: (!) 128/95 140/83  Pulse: 64 61  Resp: 12 16  Temp:  36.6 C  SpO2: 100% 99%    Last Pain:  Vitals:   07/22/17 1509  TempSrc:   PainSc: 3                  Catalina Gravel

## 2017-07-22 NOTE — Anesthesia Procedure Notes (Signed)
Procedure Name: LMA Insertion Date/Time: 07/22/2017 1:25 PM Performed by: Maryella Shivers, CRNA Pre-anesthesia Checklist: Patient identified, Emergency Drugs available, Suction available and Patient being monitored Patient Re-evaluated:Patient Re-evaluated prior to induction Oxygen Delivery Method: Circle system utilized Preoxygenation: Pre-oxygenation with 100% oxygen Induction Type: IV induction Ventilation: Mask ventilation without difficulty LMA: LMA inserted LMA Size: 5.0 Number of attempts: 1 Airway Equipment and Method: Bite block Placement Confirmation: positive ETCO2 Tube secured with: Tape Dental Injury: Teeth and Oropharynx as per pre-operative assessment

## 2017-07-22 NOTE — Op Note (Signed)
07/22/2017  11:22 AM  PATIENT:  Dakota Johnston  42 y.o. 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 Grandville Silos, MD  PHYSICIAN ASSISTANT: Morley Kos, OPA-C  ANESTHESIA:  general  SPECIMENS: To pathology  DRAINS:   None  EBL:  less than 50 mL  PREOPERATIVE INDICATIONS:  Dakota Johnston is a  42 y.o. 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  The risks benefits and alternatives were discussed with the patient preoperatively including but not limited to the risks of infection, bleeding, nerve injury, cardiopulmonary complications, the need for revision surgery, among others, and the patient verbalized understanding and consented to proceed.  OPERATIVE IMPLANTS: None  OPERATIVE PROCEDURE:  After receiving prophylactic antibiotics, the patient was escorted to the operative theatre and placed in a supine position. General anesthesia was administered. A surgical "time-out" was performed during which the planned procedure, proposed operative site, and the correct patient identity were compared to the operative consent and agreement confirmed by the circulating nurse according to current facility policy.  Following application of a tourniquet to the operative extremity, the exposed skin was prepped with Chloraprep and draped in the usual sterile fashion.  The limb was exsanguinated with an Esmarch bandage (Not exsanguinating over the mass itself)and the tourniquet inflated to approximately 11mmHg higher than systolic BP.  A linear longitudinal incision was made overlying the prominence of the mass.  It was made sharply with a scalpel, subcutaneous tissues were dissected with blunt and spreading dissection.  The mass was encountered, with some  cutaneous nerves plastered on its surface.  It was largely encapsulated appear grossly consistent with a giant cell tumor of tendon sheath.  There was a lobe of the mass that penetrated into the thenar musculature, and much of it that went under both the EPB and EPL tendons into the dorsal first web space.  The mass was first freed on its radial extent, allowing the mass to be lifted off the metacarpal.  It was lifted including the periosteum.  Although the mass was largely encapsulated, there were areas on the periphery where the subcutaneous tissues and fatty tissues were stained of the yellowish orange color of the mass, almost as if the mass was soft, about the consistency of egg yolk.  The mass was fully excised with care to protect and preserve as much of the dorsal cutaneous nerves as could be found and protected.  It did not penetrate into the MP joint of the thumb, but did encroach upon the capsule.  Once the mass was removed, the wound was copiously irrigated and the tourniquet released.  Some additional hemostasis was obtained with bipolar electrocautery and a portion of the redundant skin was excised to allow for closure without excessive are baggy loose skin.  Half percent Marcaine was instilled in and about the skin incision for postoperative pain control and additional hemostasis.  Closure was performed with 4-0 Vicryl Rapide horizontal mattress suture.  A light dressing was applied to the hand that he was awakened and taken to the recovery room in stable condition, breathing spontaneously.  DISPOSITION: He will be discharged home today with typical instructions, returning 10-15 days, also for review of his pathology report.

## 2017-07-22 NOTE — Transfer of Care (Signed)
Immediate Anesthesia Transfer of Care Note  Patient: Dakota Johnston  Procedure(s) Performed: LEFT THUMB MASS EXCISION (Left )  Patient Location: PACU  Anesthesia Type:General  Level of Consciousness: sedated  Airway & Oxygen Therapy: Patient Spontanous Breathing and Patient connected to face mask oxygen  Post-op Assessment: Report given to RN and Post -op Vital signs reviewed and stable  Post vital signs: Reviewed and stable  Last Vitals:  Vitals Value Taken Time  BP 118/79 07/22/2017  2:05 PM  Temp    Pulse 71 07/22/2017  2:06 PM  Resp 12 07/22/2017  2:06 PM  SpO2 100 % 07/22/2017  2:06 PM  Vitals shown include unvalidated device data.  Last Pain:  Vitals:   07/22/17 1108  TempSrc: Oral         Complications: No apparent anesthesia complications

## 2017-07-23 ENCOUNTER — Encounter (HOSPITAL_BASED_OUTPATIENT_CLINIC_OR_DEPARTMENT_OTHER): Payer: Self-pay | Admitting: Orthopedic Surgery

## 2018-10-16 IMAGING — DX DG CHEST 2V
2 series · 2 of 2 positions shown · non-contrast
Comparison: 09/28/2010 CT chest

CLINICAL DATA: Cough, productive for 1 month

EXAM:
CHEST  2 VIEW

[chest pa]
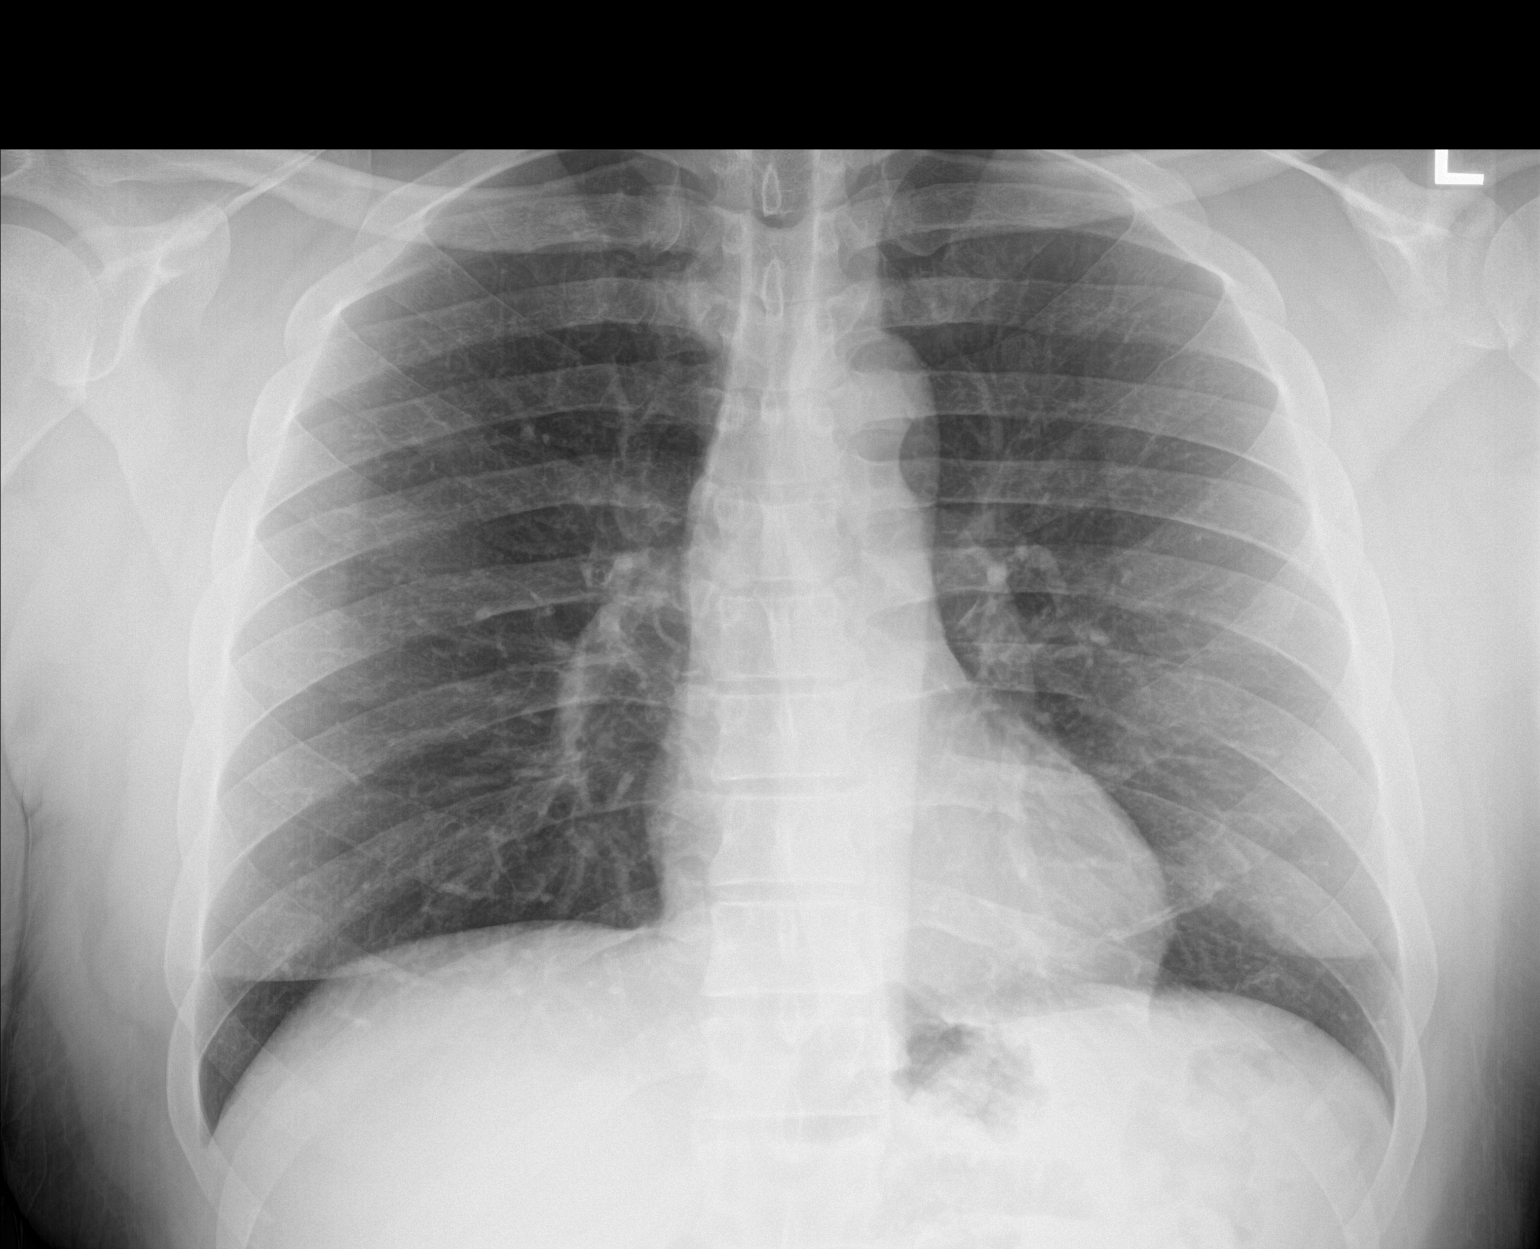

[chest lat]
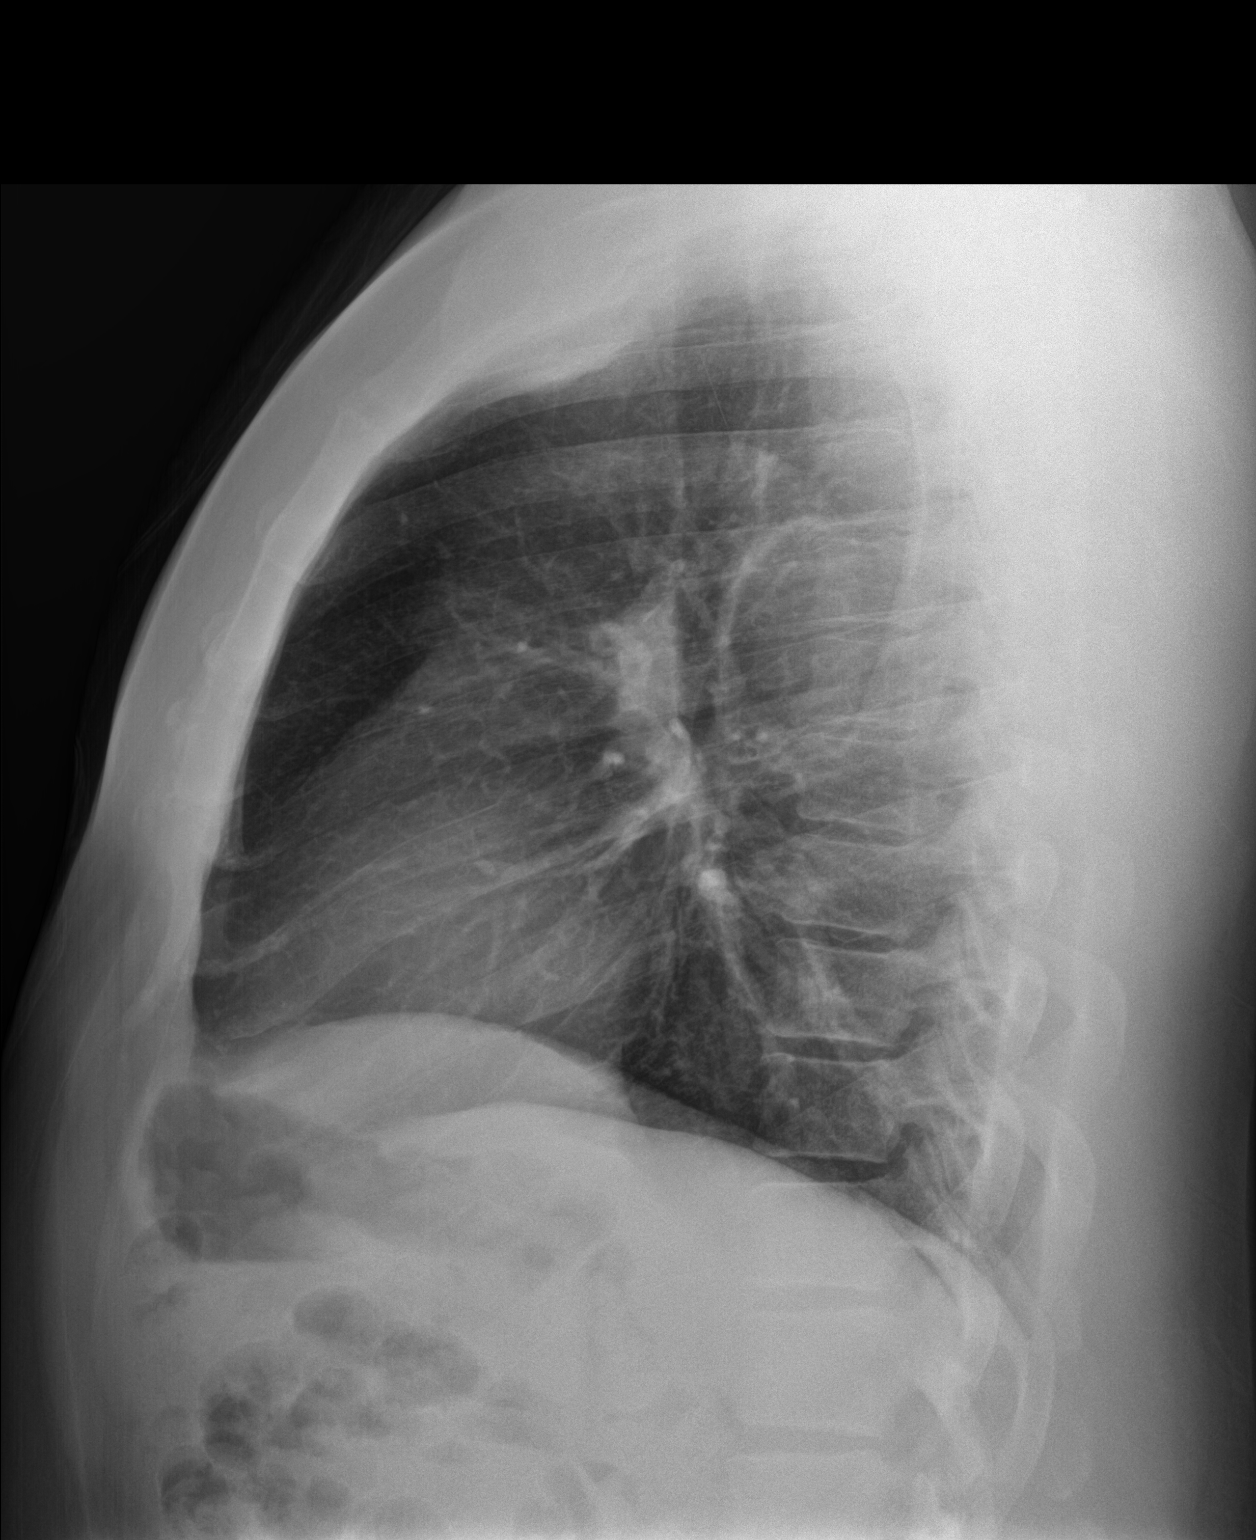

[2 of 2 positions shown; findings below may reference images not displayed]

FINDINGS: The heart size and mediastinal contours are within normal limits.
Both lungs are clear. The visualized skeletal structures are
unremarkable.
IMPRESSION: No active cardiopulmonary disease.

## 2019-07-02 ENCOUNTER — Ambulatory Visit: Payer: 59 | Attending: Internal Medicine

## 2019-07-02 DIAGNOSIS — Z23 Encounter for immunization: Secondary | ICD-10-CM

## 2019-07-02 NOTE — Progress Notes (Signed)
   Covid-19 Vaccination Clinic  Name:  [name]    MRN: [id number] DOB: Jul 13, 1975  07/02/2019  Mr. [name] was observed post Covid-19 immunization for 15 minutes without incident. He was provided with Vaccine Information Sheet and instruction to access the V-Safe system.   Mr. Bee was instructed to call 911 with any severe reactions post vaccine: Marland Kitchen Difficulty breathing  . Swelling of face and throat  . A fast heartbeat  . A bad rash all over body  . Dizziness and weakness   Immunizations Administered    Name Date Dose VIS Date Route   Pfizer COVID-19 Vaccine 07/02/2019  3:24 PM 0.3 mL 03/27/2019 Intramuscular   Manufacturer: [place]   Lot: [id number]   [place]: [id number]

## 2019-07-27 ENCOUNTER — Ambulatory Visit: Payer: 59 | Attending: Internal Medicine

## 2019-07-27 DIAGNOSIS — Z23 Encounter for immunization: Secondary | ICD-10-CM

## 2020-04-30 ENCOUNTER — Ambulatory Visit: Payer: Self-pay

## 2020-06-11 ENCOUNTER — Ambulatory Visit (INDEPENDENT_AMBULATORY_CARE_PROVIDER_SITE_OTHER): Payer: 59

## 2020-06-11 DIAGNOSIS — Z23 Encounter for immunization: Secondary | ICD-10-CM

## 2020-06-11 NOTE — Progress Notes (Signed)
   Covid-19 Vaccination Clinic  Name:  ISHAAN VILLAMAR    MRN: [id number] DOB: 05-10-1975  06/11/2020  Mr. Eberlin was observed post Covid-19 immunization for 15 minutes without incident. He was provided with Vaccine Information Sheet and instruction to access the V-Safe system.   Mr. Petsch was instructed to call 911 with any severe reactions post vaccine: Marland Kitchen Difficulty breathing  . Swelling of face and throat  . A fast heartbeat  . A bad rash all over body  . Dizziness and weakness   Immunizations Administered    Name Date Dose VIS Date Route   PFIZER Comrnaty(Gray TOP) Covid-19 Vaccine 06/11/2020 10:24 AM 0.3 mL 03/24/2020 Intramuscular   Manufacturer: [employer]   Lot: [id number]   ND[facility]: [phone]

## 2021-09-18 ENCOUNTER — Ambulatory Visit: Payer: Self-pay | Admitting: Surgery

## 2021-09-18 DIAGNOSIS — A630 Anogenital (venereal) warts: Secondary | ICD-10-CM

## 2022-12-05 ENCOUNTER — Ambulatory Visit: Payer: 59 | Admitting: Podiatry

## 2022-12-12 ENCOUNTER — Ambulatory Visit (INDEPENDENT_AMBULATORY_CARE_PROVIDER_SITE_OTHER): Payer: 59 | Admitting: Podiatry

## 2022-12-12 ENCOUNTER — Encounter: Payer: Self-pay | Admitting: Podiatry

## 2022-12-12 DIAGNOSIS — L989 Disorder of the skin and subcutaneous tissue, unspecified: Secondary | ICD-10-CM | POA: Diagnosis not present

## 2022-12-12 DIAGNOSIS — L84 Corns and callosities: Secondary | ICD-10-CM | POA: Diagnosis not present

## 2022-12-12 NOTE — Progress Notes (Signed)
   Chief Complaint  Patient presents with   Callouses    Patient is here for callouses between toes 4/5 on left foot callous on both feet    HPI: 47 y.o. male presenting today as a new patient for evaluation of symptomatic calluses to the bilateral feet.  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  He presents for further treatment evaluation  Past Medical History:  Diagnosis Date   Allergy    Asthma     Past Surgical History:  Procedure Laterality Date   EXCISION METACARPAL MASS Left 07/22/2017   Procedure: LEFT THUMB MASS EXCISION;  Surgeon: Mack Hook, MD;  Location: [facility];  Service: Orthopedics;  Laterality: Left;   TUMOR REMOVAL     leg   TUMOR REMOVAL     WISDOM TOOTH EXTRACTION  2017    No Known Allergies   Physical Exam: General: The patient is alert and oriented x3 in no acute distress.  Dermatology: Hyperkeratotic tissue noted to the 4th interdigital webspace of the left foot.  No ulcer noted.  Heavily macerated skin and callus noted consistent with a heloma molle.  There are additional symptomatic calluses noted diffusely throughout the bilateral feet  Vascular: Palpable pedal pulses bilaterally. Capillary refill within normal limits.  No appreciable edema.  No erythema.  Neurological: Grossly intact via light touch  Musculoskeletal Exam: No pedal deformities noted   Assessment/Plan of Care: 1.  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  Calluses bilateral  -Patient evaluated -Excisional debridement of the hyperkeratotic calluses was performed today using a tissue nipper and 312 scalpel without incident or bleeding.  Patient felt significant relief -Recommend good supportive tennis shoes that are wide fitting and do  not constrict the toebox -Advised against going barefoot.  Recommend good supportive shoes and sneakers. -OTC power steps prefabricated arch supports were dispensed to support the medial longitudinal arch of the foot and offload pressure from the callus lesions -Return to clinic as needed  *Works at [employer] and lawns on weekends     Felecia Shelling, DPM [facility]  Dr. Felecia Shelling, DPM    [address] [place], [place] [address]                Office [phone]  Fax [phone]
